# Patient Record
Sex: Female | Born: 2000
Health system: Southern US, Community
[De-identification: ages and names within clinical notes are randomized; demographics above are authoritative.]

## PROBLEM LIST (undated history)

## (undated) DIAGNOSIS — K219 Gastro-esophageal reflux disease without esophagitis: Secondary | ICD-10-CM

---
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
## 2018-04-27 ENCOUNTER — Ambulatory Visit (INDEPENDENT_AMBULATORY_CARE_PROVIDER_SITE_OTHER): Payer: Self-pay | Admitting: Family Medicine

## 2018-04-27 VITALS — BP 118/70 | HR 63 | Temp 98.2°F | Resp 18 | Wt 129.0 lb

## 2018-04-27 DIAGNOSIS — K5903 Drug induced constipation: Secondary | ICD-10-CM

## 2018-04-27 DIAGNOSIS — K529 Noninfective gastroenteritis and colitis, unspecified: Secondary | ICD-10-CM

## 2018-04-27 NOTE — Progress Notes (Signed)
17 y/o who on 8/20 ate sushi with friends and they all became sick afterwards- Beverly Gonzales has since experienced nausea, vomiting, and diarrhea. She was prescribed Bentyl, Prilosec, and Zofran and is now less symptomatic but has constipation and stomach pain. She has frequent urges and gas but is not able to pass any stool. She reports mild abdominal discomfort-   Review of Systems  Constitutional: Negative for chills, fever and malaise/fatigue.  HENT: Negative for congestion, ear discharge, ear pain, sinus pain and sore throat.   Eyes: Negative.   Respiratory: Negative for cough, sputum production and shortness of breath.   Cardiovascular: Negative.  Negative for chest pain.  Gastrointestinal: Positive for abdominal pain and constipation. Negative for nausea and vomiting.  Genitourinary: Negative for dysuria, frequency, hematuria and urgency.  Musculoskeletal: Negative for myalgias.  Skin: Negative.   Neurological: Negative for headaches.  Endo/Heme/Allergies: Negative.   Psychiatric/Behavioral: Negative.     O: Vitals:   04/27/18 1528  BP: 118/70  Pulse: 63  Resp: 18  Temp: 98.2 F (36.8 C)  SpO2: 98%     Physical Exam  Constitutional: She is oriented to person, place, and time. Vital signs are normal. She appears well-developed and well-nourished. She is active.  Non-toxic appearance. She does not have a sickly appearance.  HENT:  Head: Normocephalic.  Right Ear: Hearing, tympanic membrane, external ear and ear canal normal.  Left Ear: Hearing, tympanic membrane, external ear and ear canal normal.  Nose: Nose normal.  Mouth/Throat: Uvula is midline and oropharynx is clear and moist.  Neck: Normal range of motion. Neck supple.  Cardiovascular: Normal rate, regular rhythm, normal heart sounds and normal pulses.  Pulmonary/Chest: Effort normal and breath sounds normal.  Abdominal: Soft. Normal appearance. She exhibits no distension, no pulsatile midline mass and no mass. Bowel  sounds are increased. There is generalized tenderness and tenderness in the right upper quadrant, right lower quadrant, left upper quadrant and left lower quadrant. There is no rigidity, no rebound, no guarding, no CVA tenderness, no tenderness at McBurney's point and negative Murphy's sign.  Abdomen is soft- no guarding mild grimacing with deeper palpation  Musculoskeletal: Normal range of motion.  Lymphadenopathy:       Head (right side): No submental and no submandibular adenopathy present.       Head (left side): No submental and no submandibular adenopathy present.    She has no cervical adenopathy.  Neurological: She is alert and oriented to person, place, and time.  Psychiatric: She has a normal mood and affect.  Vitals reviewed.  A: 1. Gastroenteritis   2. Drug-induced constipation    P: Exam findings, diagnosis etiology and medication use and indications reviewed with patient. Follow- Up and discharge instructions provided. No emergent/urgent issues found on exam.  Patient verbalized understanding of information provided and agrees with plan of care (POC), all questions answered.  1. Gastroenteritis Began 8/20- complications from issues related to condition persists- symptomatic treatment continued Work/school note for the next 48 hours provided- Advised to f/u in clinic or in urgent care if symptoms persist. 2. Drug-induced constipation Abdominal pain persists- but nausea and vomiting resolved- patient take frequent trips to bathroom with urges but denies successful gas or BM since diarrhea resolved

## 2018-04-27 NOTE — Patient Instructions (Addendum)
PLAN Follow up in Urgent care or Emergency room if symptoms worsen or fever develops  Constipation, Adult Constipation is when a person has fewer bowel movements in a week than normal, has difficulty having a bowel movement, or has stools that are dry, hard, or larger than normal. Constipation may be caused by an underlying condition. It may become worse with age if a person takes certain medicines and does not take in enough fluids. Follow these instructions at home: Eating and drinking   Eat foods that have a lot of fiber, such as fresh fruits and vegetables, whole grains, and beans.  Limit foods that are high in fat, low in fiber, or overly processed, such as french fries, hamburgers, cookies, candies, and soda.  Drink enough fluid to keep your urine clear or pale yellow. General instructions  Exercise regularly or as told by your health care provider.  Go to the restroom when you have the urge to go. Do not hold it in.  Take over-the-counter and prescription medicines only as told by your health care provider. These include any fiber supplements.  Practice pelvic floor retraining exercises, such as deep breathing while relaxing the lower abdomen and pelvic floor relaxation during bowel movements.  Watch your condition for any changes.  Keep all follow-up visits as told by your health care provider. This is important. Contact a health care provider if:  You have pain that gets worse.  You have a fever.  You do not have a bowel movement after 4 days.  You vomit.  You are not hungry.  You lose weight.  You are bleeding from the anus.  You have thin, pencil-like stools. Get help right away if:  You have a fever and your symptoms suddenly get worse.  You leak stool or have blood in your stool.  Your abdomen is bloated.  You have severe pain in your abdomen.  You feel dizzy or you faint. This information is not intended to replace advice given to you by your  health care provider. Make sure you discuss any questions you have with your health care provider. Document Released: 05/18/2004 Document Revised: 03/09/2016 Document Reviewed: 02/08/2016 Elsevier Interactive Patient Education  2018 [employer].  Simethicone oral drops, suspension What is this medicine? SIMETHICONE (sye METH i kone) is used to decrease the discomfort caused by gas. This medicine may be used for other purposes; ask your health care provider or pharmacist if you have questions. COMMON BRAND NAME(S): Google, Gas Relief, Gas-X, Genasyme, [employer], Infantaire, Little Remedies for Tummys, Mylicon, PediaCare Infant's Gas Relief What should I tell my health care provider before I take this medicine? They need to know if you have any of these conditions: -an unusual or allergic reaction to simethicone, other medicines, foods, dyes, or preservatives -pregnant or trying to get pregnant -breast-feeding How should I use this medicine? Take this medicine by mouth. Use the special dropper included with the medicine. The dosage can be mixed with one ounce of cool water, infant formula or other suitable liquids to ease administration. Follow the directions on the label or those given to you by your doctor or health care professional. Do not take your medicine more often than directed. Talk to your pediatrician regarding the use of this medicine in children. While this drug may be used in children as young as newborns for selected conditions, precautions do apply. Overdosage: If you think you have taken too much of this medicine contact a poison control  center or emergency room at once. NOTE: This medicine is only for you. Do not share this medicine with others. What if I miss a dose? This does not apply; this medicine is not for regular use. What may interact with this medicine? Interactions are not expected. This list may not describe all possible interactions. Give  your health care provider a list of all the medicines, herbs, non-prescription drugs, or dietary supplements you use. Also tell them if you smoke, drink alcohol, or use illegal drugs. Some items may interact with your medicine. What should I watch for while using this medicine? Tell your doctor or healthcare professional if your symptoms do not start to get better or if they get worse, or if you have severe pain, diarrhea, constipation, or blood in your stool. These could be signs of a more serious condition. What side effects may I notice from receiving this medicine? There are no reported side effects of this medicine. This list may not describe all possible side effects. Call your doctor for medical advice about side effects. You may report side effects to FDA at 1-800-FDA-1088. Where should I keep my medicine? Keep out of the reach of children. Store at room temperature between 15 and 30 degrees C (59 and 86 degrees F). Keep container tightly closed. Throw away any unused medicine after the expiration date. NOTE: This sheet is a summary. It may not cover all possible information. If you have questions about this medicine, talk to your doctor, pharmacist, or health care provider.  2018 Elsevier/Gold Standard (2008-04-21 15:41:57) Docusate capsules What is this medicine? DOCUSATE (doc CUE sayt) is stool softener. It helps prevent constipation and straining or discomfort associated with hard or dry stools. This medicine may be used for other purposes; ask your health care provider or pharmacist if you have questions. COMMON BRAND NAME(S): Colace, Colace Clear, Correctol, D.O.S., DC, Doc-Q-Lace, DocuLace, Docusoft S, DOK, DOK Extra Strength, Dulcolax, Genasoft, Kao-Tin, Kaopectate Liqui-Gels, Phillips Stool Softener, Stool Softener, Stool Softner DC, Sulfolax, Sur-Q-Lax, Surfak, Uni-Ease What should I tell my health care provider before I take this medicine? They need to know if you have any of  these conditions: -nausea or vomiting -severe constipation -stomach pain -sudden change in bowel habit lasting more than 2 weeks -an unusual or allergic reaction to docusate, other medicines, foods, dyes, or preservatives -pregnant or trying to get pregnant -breast-feeding How should I use this medicine? Take this medicine by mouth with a glass of water. Follow the directions on the label. Take your doses at regular intervals. Do not take your medicine more often than directed. Talk to your pediatrician regarding the use of this medicine in children. While this medicine may be prescribed for children as young as 2 years for selected conditions, precautions do apply. Overdosage: If you think you have taken too much of this medicine contact a poison control center or emergency room at once. NOTE: This medicine is only for you. Do not share this medicine with others. What if I miss a dose? If you miss a dose, take it as soon as you can. If it is almost time for your next dose, take only that dose. Do not take double or extra doses. What may interact with this medicine? -mineral oil This list may not describe all possible interactions. Give your health care provider a list of all the medicines, herbs, non-prescription drugs, or dietary supplements you use. Also tell them if you smoke, drink alcohol, or use illegal drugs. Some items  may interact with your medicine. What should I watch for while using this medicine? Do not use for more than one week without advice from your doctor or health care professional. If your constipation returns, check with your doctor or health care professional. Drink plenty of water while taking this medicine. Drinking water helps decrease constipation. Stop using this medicine and contact your doctor or health care professional if you experience any rectal bleeding or do not have a bowel movement after use. These could be signs of a more serious condition. What side  effects may I notice from receiving this medicine? Side effects that you should report to your doctor or health care professional as soon as possible: -allergic reactions like skin rash, itching or hives, swelling of the face, lips, or tongue Side effects that usually do not require medical attention (report to your doctor or health care professional if they continue or are bothersome): -diarrhea -stomach cramps -throat irritation This list may not describe all possible side effects. Call your doctor for medical advice about side effects. You may report side effects to FDA at 1-800-FDA-1088. Where should I keep my medicine? Keep out of the reach of children. Store at room temperature between 15 and 30 degrees C (59 and 86 degrees F). Throw away any unused medicine after the expiration date. NOTE: This sheet is a summary. It may not cover all possible information. If you have questions about this medicine, talk to your doctor, pharmacist, or health care provider.  2018 Elsevier/Gold Standard (2007-12-11 15:56:49) Abdominal Pain, Adult Many things can cause belly (abdominal) pain. Most times, belly pain is not dangerous. Many cases of belly pain can be watched and treated at home. Sometimes belly pain is serious, though. Your doctor will try to find the cause of your belly pain. Follow these instructions at home:  Take over-the-counter and prescription medicines only as told by your doctor. Do not take medicines that help you poop (laxatives) unless told to by your doctor.  Drink enough fluid to keep your pee (urine) clear or pale yellow.  Watch your belly pain for any changes.  Keep all follow-up visits as told by your doctor. This is important. Contact a doctor if:  Your belly pain changes or gets worse.  You are not hungry, or you lose weight without trying.  You are having trouble pooping (constipated) or have watery poop (diarrhea) for more than 2-3 days.  You have pain when you  pee or poop.  Your belly pain wakes you up at night.  Your pain gets worse with meals, after eating, or with certain foods.  You are throwing up and cannot keep anything down.  You have a fever. Get help right away if:  Your pain does not go away as soon as your doctor says it should.  You cannot stop throwing up.  Your pain is only in areas of your belly, such as the right side or the left lower part of the belly.  You have bloody or black poop, or poop that looks like tar.  You have very bad pain, cramping, or bloating in your belly.  You have signs of not having enough fluid or water in your body (dehydration), such as: ? Dark pee, very little pee, or no pee. ? Cracked lips. ? Dry mouth. ? Sunken eyes. ? Sleepiness. ? Weakness. This information is not intended to replace advice given to you by your health care provider. Make sure you discuss any questions you have with  your health care provider. Document Released: 02/06/2008 Document Revised: 03/09/2016 Document Reviewed: 02/01/2016 Elsevier Interactive Patient Education  2018 [employer]

## 2018-04-29 ENCOUNTER — Encounter: Payer: Self-pay | Admitting: Family Medicine

## 2018-05-02 ENCOUNTER — Emergency Department (HOSPITAL_BASED_OUTPATIENT_CLINIC_OR_DEPARTMENT_OTHER)
Admission: EM | Admit: 2018-05-02 | Discharge: 2018-05-02 | Disposition: A | Discharge status: Home / Self Care | Payer: No Typology Code available for payment source | Attending: Emergency Medicine | Admitting: Emergency Medicine

## 2018-05-02 ENCOUNTER — Emergency Department (HOSPITAL_BASED_OUTPATIENT_CLINIC_OR_DEPARTMENT_OTHER): Payer: No Typology Code available for payment source

## 2018-05-02 ENCOUNTER — Encounter (HOSPITAL_COMMUNITY): Payer: Self-pay | Admitting: Emergency Medicine

## 2018-05-02 ENCOUNTER — Ambulatory Visit (HOSPITAL_COMMUNITY)
Admission: EM | Admit: 2018-05-02 | Discharge: 2018-05-02 | Disposition: A | Discharge status: Home / Self Care | Payer: No Typology Code available for payment source | Source: Home / Self Care | Attending: Internal Medicine | Admitting: Internal Medicine

## 2018-05-02 ENCOUNTER — Other Ambulatory Visit: Payer: Self-pay

## 2018-05-02 DIAGNOSIS — K5900 Constipation, unspecified: Secondary | ICD-10-CM | POA: Diagnosis not present

## 2018-05-02 DIAGNOSIS — Z79899 Other long term (current) drug therapy: Secondary | ICD-10-CM | POA: Insufficient documentation

## 2018-05-02 DIAGNOSIS — R112 Nausea with vomiting, unspecified: Secondary | ICD-10-CM | POA: Diagnosis not present

## 2018-05-02 DIAGNOSIS — Z3202 Encounter for pregnancy test, result negative: Secondary | ICD-10-CM | POA: Insufficient documentation

## 2018-05-02 DIAGNOSIS — R1031 Right lower quadrant pain: Secondary | ICD-10-CM | POA: Insufficient documentation

## 2018-05-02 DIAGNOSIS — R1084 Generalized abdominal pain: Secondary | ICD-10-CM

## 2018-05-02 LAB — CBC
HCT: 36.6 % (ref 36.0–46.0)
Hemoglobin: 12.5 g/dL (ref 12.0–15.0)
MCH: 29.1 pg (ref 26.0–34.0)
MCHC: 34.2 g/dL (ref 30.0–36.0)
MCV: 85.3 fL (ref 78.0–100.0)
Platelets: 167 10*3/uL (ref 150–400)
RBC: 4.29 MIL/uL (ref 3.87–5.11)
RDW: 12.8 % (ref 11.5–15.5)
WBC: 7 10*3/uL (ref 4.0–10.5)

## 2018-05-02 LAB — POCT URINALYSIS DIP (DEVICE)
Glucose, UA: NEGATIVE mg/dL
Hgb urine dipstick: NEGATIVE
KETONES UR: NEGATIVE mg/dL
Leukocytes, UA: NEGATIVE
Nitrite: NEGATIVE
PH: 6 (ref 5.0–8.0)
PROTEIN: 30 mg/dL — AB
Specific Gravity, Urine: >=1.03 (ref 1.005–1.030)
Urobilinogen, UA: 0.2 mg/dL (ref 0.0–1.0)

## 2018-05-02 LAB — COMPREHENSIVE METABOLIC PANEL
ALT: 10 U/L (ref 0–44)
AST: 15 U/L (ref 15–41)
Albumin: 4.3 g/dL (ref 3.5–5.0)
Alkaline Phosphatase: 44 U/L (ref 38–126)
Anion gap: 9 (ref 5–15)
BILIRUBIN TOTAL: 1 mg/dL (ref 0.3–1.2)
BUN: 14 mg/dL (ref 6–20)
CO2: 24 mmol/L (ref 22–32)
Calcium: 9.3 mg/dL (ref 8.9–10.3)
Chloride: 103 mmol/L (ref 98–111)
Creatinine, Ser: 0.58 mg/dL (ref 0.44–1.00)
GFR calc Af Amer: >60 mL/min (ref >60)
Glucose, Bld: 122 mg/dL — ABNORMAL HIGH (ref 70–99)
Potassium: 3.6 mmol/L (ref 3.5–5.1)
Sodium: 136 mmol/L (ref 135–145)
Total Protein: 6.8 g/dL (ref 6.5–8.1)

## 2018-05-02 LAB — POCT PREGNANCY, URINE: Preg Test, Ur: NEGATIVE

## 2018-05-02 LAB — LIPASE, BLOOD: Lipase: 32 U/L (ref 11–51)

## 2018-05-02 MED ORDER — IOPAMIDOL (ISOVUE-300) INJECTION 61%
30.0000 mL | Freq: Once | INTRAVENOUS | Status: AC | PRN
  Administered 2018-05-02: 15 mL via ORAL

## 2018-05-02 MED ORDER — SODIUM CHLORIDE 0.9 % IV BOLUS
1000.0000 mL | Freq: Once | INTRAVENOUS | Status: AC
  Administered 2018-05-02: 1000 mL via INTRAVENOUS

## 2018-05-02 MED ORDER — IOPAMIDOL (ISOVUE-300) INJECTION 61%
100.0000 mL | Freq: Once | INTRAVENOUS | Status: AC | PRN
  Administered 2018-05-02: 100 mL via INTRAVENOUS

## 2018-05-02 NOTE — ED Notes (Signed)
Patient transported to CT 

## 2018-05-02 NOTE — ED Notes (Signed)
Father in hallway asking when pt is going for CT, informed that they have someone on the table and that they will be over to get his daughter when they are finished

## 2018-05-02 NOTE — Discharge Instructions (Addendum)
Urine did not show signs of infection Urine pregnancy was negative Recommending further evaluation and management in the ED Patient and mother aware.

## 2018-05-02 NOTE — Discharge Instructions (Addendum)
You were evaluated in the emergency department for 10 days of diffuse crampy abdominal pain.  You had blood work that was unremarkable.  Your CAT scan did not show an obvious cause of your symptoms other than moderate amount of constipation which could potentially be causing some of your symptoms.  We do recommend that you increase fiber and continue the stool softener and follow-up with your primary care doctor.  They may need to refer you onto a gastroenterologist.

## 2018-05-02 NOTE — ED Triage Notes (Signed)
Pt reports 1 episode of vomiting today after eating. However, pt ate on the way to ED.

## 2018-05-02 NOTE — ED Provider Notes (Addendum)
[facility]   [id number] 05/02/18 Arrival Time: 1834  CC: ABDOMINAL DISCOMFORT  SUBJECTIVE: Mother present.  Beverly Gonzales is a 17 y.o. female who presents with complaint of abdominal discomfort that began abruptly 10 days ago.  Symptoms began after eating sushi.  Discomfort is diffuse about the abdomen.  Describes as stable constant and "pressure."  Pain is 5-6/10.  Has tried bentyl, zyrtec, and prilosec, then treated with stool softeners and gas-x without relief.  Worse in sitting position.  Denies similar symptoms in the past.  Last BM last night with soft stool.    Denies fever, chills, appetite changes, weight changes, nausea, vomiting, chest pain, SOB, diarrhea, constipation, hematochezia, melena, dysuria, difficulty urinating, increased frequency or urgency, flank pain, loss of bowel or bladder function.  No LMP recorded.  ROS: As per HPI.  Past Medical History:  Diagnosis Date  . GERD (gastroesophageal reflux disease)    History reviewed. No pertinent surgical history. No Known Allergies No current facility-administered medications on file prior to encounter.    Current Outpatient Medications on File Prior to Encounter  Medication Sig Dispense Refill  . omeprazole (PRILOSEC) 10 MG capsule Take 10 mg by mouth daily.    . ondansetron (ZOFRAN) 4 MG tablet Take 4 mg by mouth every 8 (eight) hours as needed for nausea or vomiting.     Social History   Socioeconomic History  . Marital status: Single    Spouse name: Not on file  . Number of children: Not on file  . Years of education: Not on file  . Highest education level: Not on file  Occupational History  . Not on file  Social Needs  . Financial resource strain: Not on file  . Food insecurity:    Worry: Not on file    Inability: Not on file  . Transportation needs:    Medical: Not on file    Non-medical: Not on file  Tobacco Use  . Smoking status: Never Smoker  Substance and Sexual Activity  .  Alcohol use: No  . Drug use: Not on file  . Sexual activity: Not on file  Lifestyle  . Physical activity:    Days per week: Not on file    Minutes per session: Not on file  . Stress: Not on file  Relationships  . Social connections:    Talks on phone: Not on file    Gets together: Not on file    Attends religious service: Not on file    Active member of club or organization: Not on file    Attends meetings of clubs or organizations: Not on file    Relationship status: Not on file  . Intimate partner violence:    Fear of current or ex partner: Not on file    Emotionally abused: Not on file    Physically abused: Not on file    Forced sexual activity: Not on file  Other Topics Concern  . Not on file  Social History Narrative  . Not on file   No family history on file.   OBJECTIVE:  Vitals:   05/02/18 1901  BP: 101/67  Pulse: 87  Resp: 18  Temp: 98.1 F (36.7 C)  SpO2: 95%    General appearance: AOx3 in no acute distress; nontoxic appearance HEENT: NCAT. PERRL. Oropharynx clear.  Lungs: clear to auscultation bilaterally without adventitious breath sounds Heart: regular rate and rhythm.  Radial pulses 2+ symmetrical bilaterally Abdomen: soft, non-distended; normal active bowel sounds; diffuse abdominal  tenderness; mild guarding Back: no CVA tenderness Extremities: no edema; symmetrical with no gross deformities Skin: warm and dry Neurologic: normal gait Psychological: alert and cooperative; normal mood and affect  Labs: Results for orders placed or performed during the hospital encounter of 05/02/18 (from the past 24 hour(s))  POCT urinalysis dip (device)     Status: Abnormal   Collection Time: 05/02/18  7:10 PM  Result Value Ref Range   Glucose, UA NEGATIVE NEGATIVE mg/dL   Bilirubin Urine SMALL (A) NEGATIVE   Ketones, ur NEGATIVE NEGATIVE mg/dL   Specific Gravity, Urine >=1.030 1.005 - 1.030   Hgb urine dipstick NEGATIVE NEGATIVE   pH 6.0 5.0 - 8.0    Protein, ur 30 (A) NEGATIVE mg/dL   Urobilinogen, UA 0.2 0.0 - 1.0 mg/dL   Nitrite NEGATIVE NEGATIVE   Leukocytes, UA NEGATIVE NEGATIVE  Pregnancy, urine POC     Status: None   Collection Time: 05/02/18  7:13 PM  Result Value Ref Range   Preg Test, Ur NEGATIVE NEGATIVE    ASSESSMENT & PLAN:  1. Generalized abdominal pain     No orders of the defined types were placed in this encounter.  Urine did not show signs of infection Urine pregnancy was negative Recommending further evaluation and management in the ED Patient and mother aware.    More than 30 minutes spent discussing options regarding patient's care with mother and patient.  Gave option of outpatient treatment or further evaluation and management in the ED.  Patient attending college in [place], and [place] located in [place].  Patient's mother is very concerned about her daughters abdominal pain.    Reviewed expectations re: course of current medical issues. Questions answered. Outlined signs and symptoms indicating need for more acute intervention. Patient verbalized understanding. After Visit Summary given.      Rennis HardingWurst, Raniyah Curenton, PA-C 05/02/18 2052

## 2018-05-02 NOTE — ED Triage Notes (Signed)
Pt c/o stomach pain x10 days, pt was seen at [facility] on 8/25 and given bentyl, prilosec, and zofran. Pt still feels stomach pressure. Pt has off and on diarrhea and constipation.

## 2018-05-02 NOTE — ED Notes (Signed)
Jenna from radiology dropped off oral contrast for the patient and told her she would be back in about 20-25 minutes to get her for her scan.

## 2018-05-02 NOTE — ED Provider Notes (Signed)
[facility] EMERGENCY DEPARTMENT Provider Note   CSN: [id number] Arrival date & time: 05/02/18  2056     History   Chief Complaint Chief Complaint  Patient presents with  . Abdominal Pain    HPI Beverly Gonzales is a 17 y.o. female.  She is complaining of almost 2 weeks of on and off abdominal pain with associated nausea and vomiting.  Its generalized abdominal pain seems migratory at some level and pressure.  She initially had some loose stool and vomiting and they thought it was related to see she she ate.  She was treated at her school clinic with Bentyl and I believe ranitidine.  The pain continued and she stopped the medicine and then they told her she may be constipated and told her to take a laxative.  She ended up with diarrhea and now she feels she is more constipated again.  She is been back and forth from school and unable to attend class secondary to the pain.  She is brought in by her mother and father tonight after going to urgent care earlier today and they felt she should be seen for further evaluation.  She is also been on a PPI for this.  She does not normally have GI symptoms and denies being under any increased stress.  No fevers no chills no cough no shortness of breath.  She denies any vaginal bleeding or discharge and denies being sexually active.  She had a clean urine and a negative pregnancy test today.  The history is provided by the patient and a parent.  Abdominal Pain   This is a new problem. The current episode started more than 1 week ago. The problem occurs constantly. The problem has not changed since onset.The pain is associated with eating. The pain is located in the generalized abdominal region. The quality of the pain is aching and pressure-like. The pain is moderate. Associated symptoms include diarrhea, nausea, vomiting and constipation. Pertinent negatives include fever, dysuria and frequency. The symptoms are aggravated by eating and certain  positions. The symptoms are relieved by certain positions.    Past Medical History:  Diagnosis Date  . GERD (gastroesophageal reflux disease)     Patient Active Problem List   Diagnosis Date Noted  . Chest wall pain 06/23/2013  . 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   OB History   None      Home Medications    Prior to Admission medications   Medication Sig Start Date End Date Taking? Authorizing Provider  omeprazole (PRILOSEC) 10 MG capsule Take 10 mg by mouth daily.    [provider]  ondansetron (ZOFRAN) 4 MG tablet Take 4 mg by mouth every 8 (eight) hours as needed for nausea or vomiting.    [provider]    Family History No family history on file.  Social History Social History   Tobacco Use  . Smoking status: Never Smoker  Substance Use Topics  . Alcohol use: No  . Drug use: Not on file     Allergies   Patient has no known allergies.   Review of Systems Review of Systems  Constitutional: Negative for fever.  HENT: Negative for sore throat.   Eyes: Negative for visual disturbance.  Respiratory: Negative for shortness of breath.   Cardiovascular: Negative for chest pain.  Gastrointestinal: Positive for abdominal pain, constipation, diarrhea, nausea and vomiting.  Genitourinary: Negative for dysuria and frequency.  Musculoskeletal: Negative  for neck pain.  Skin: Negative for rash.  Neurological: Negative for numbness.     Physical Exam Updated Vital Signs BP 115/63 (BP Location: Left Arm)   Pulse 71   Temp 98.4 F (36.9 C) (Oral)   Resp 16   Ht 5\' 7"  (1.702 m)   Wt 58.5 kg   SpO2 100%   BMI 20.20 kg/m   Physical Exam  Constitutional: She appears well-developed and well-nourished. No distress.  HENT:  Head: Normocephalic and atraumatic.  Mouth/Throat: Oropharynx is clear and moist.  Eyes: Conjunctivae are normal.  Neck: Neck supple.  Cardiovascular: Normal rate, regular rhythm  and normal heart sounds.  No murmur heard. Pulmonary/Chest: Effort normal and breath sounds normal. No respiratory distress.  Abdominal: Soft. Normal appearance. There is generalized tenderness. There is no rigidity, no rebound and no guarding.  Musculoskeletal: Normal range of motion. She exhibits no edema, tenderness or deformity.  Neurological: She is alert.  Skin: Skin is warm and dry. Capillary refill takes less than 2 seconds.  Psychiatric: She has a normal mood and affect.  Nursing note and vitals reviewed.    ED Treatments / Results  Labs (all labs ordered are listed, but only abnormal results are displayed) Labs Reviewed  COMPREHENSIVE METABOLIC PANEL - Abnormal; Notable for the following components:      Result Value   Glucose, Bld 122 (*)    All other components within normal limits  LIPASE, BLOOD  CBC    EKG None  Radiology Ct Abdomen Pelvis W Contrast  Result Date: 05/02/2018 CLINICAL DATA:  Right lower quadrant pain with nausea, vomiting and some constipation for 10 days. EXAM: CT ABDOMEN AND PELVIS WITH CONTRAST TECHNIQUE: Multidetector CT imaging of the abdomen and pelvis was performed using the standard protocol following bolus administration of intravenous contrast. CONTRAST:  15mL ISOVUE-300 IOPAMIDOL (ISOVUE-300) INJECTION 61%, 100mL ISOVUE-300 IOPAMIDOL (ISOVUE-300) INJECTION 61% COMPARISON:  None. FINDINGS: Lower chest: Normal size heart. Clear lung bases. No pericardial effusion. Hepatobiliary: Mild periportal edema which can be seen in hypervolemia, hepatic congestion and hepatitis among some possibilities though not exclusive. Given distended appearance of the IVC, suspect hypervolemia. No space-occupying mass of the liver. Fatty infiltration along the falciform ligament is seen. Gallbladder is contracted and free of stones. Pancreas: Normal Spleen: Normal Adrenals/Urinary Tract: Normal bilateral adrenal glands and kidneys. Mild fullness of the renal pelves  without hydroureteronephrosis. The urinary bladder is unremarkable. No bladder calculus or focal mural thickening. Stomach/Bowel: Normal appearing appendix is visualized. The stomach is somewhat distended with ingested food and contrast. The duodenal sweep and ligament of Treitz is well small bowel are unremarkable. Increased fecal retention is seen within the colon. Vascular/Lymphatic: No significant vascular findings are present. No enlarged abdominal or pelvic lymph nodes. Reproductive: Uterus is retroverted and retroflexed. No adnexal mass. Physiologic sized follicles are identified with dominant follicle noted on the left measuring up to 2 cm. Other: No free air nor free fluid. Musculoskeletal: No acute or significant osseous findings. IMPRESSION: 1. No findings for the patient's right lower quadrant pain apart from increased fecal retention within the colon. The appendix is normal in appearance. No obstructive uropathy is seen. 2. Mild periportal edema is nonspecific but can be seen in hypervolemic state, hepatitis or hepatic congestion among some possibilities. Suspect hypervolemia given distended IVC. Electronically Signed   By: Tollie Ethavid  Kwon M.D.   On: 05/02/2018 23:09    Procedures Procedures (including critical care time)  Medications Ordered in ED Medications  sodium chloride  0.9 % bolus 1,000 mL (has no administration in time range)     Initial Impression / Assessment and Plan / ED Course  I have reviewed the triage vital signs and the nursing notes.  Pertinent labs & imaging results that were available during my care of the patient were reviewed by me and considered in my medical decision making (see chart for details).  Clinical Course as of May 04 951  Fri May 02, 2018  2318 Patient's lab work is unremarkable here.  She also had a negative pregnancy and a urinalysis earlier today that I reviewed.  Her CT is most significant for some increased fecal retention in her colon.  I  reviewed this with the patient and her mom and recommended that she increase fiber and continue stool softener.  I also recommended that she follow-up with her primary care doctor and consider getting referred on to a GI doctor.   [MB]    Clinical Course User Index [MB] Terrilee Files, MD     Final Clinical Impressions(s) / ED Diagnoses   Final diagnoses:  Generalized abdominal pain  Constipation, unspecified constipation type    ED Discharge Orders    None       Terrilee Files, MD 05/03/18 [phone]

## 2018-05-02 NOTE — ED Triage Notes (Signed)
Pt c/o abd >10 days. Pt has been seen several times for same. [facility] Pt reports pain across upper abd.

## 2018-05-02 NOTE — ED Triage Notes (Signed)
[facility] and were both [facility].

## 2018-05-26 ENCOUNTER — Telehealth: Payer: Self-pay | Admitting: Internal Medicine

## 2018-05-26 ENCOUNTER — Ambulatory Visit (INDEPENDENT_AMBULATORY_CARE_PROVIDER_SITE_OTHER): Payer: No Typology Code available for payment source | Admitting: Internal Medicine

## 2018-05-26 ENCOUNTER — Encounter: Payer: Self-pay | Admitting: Internal Medicine

## 2018-05-26 ENCOUNTER — Other Ambulatory Visit (INDEPENDENT_AMBULATORY_CARE_PROVIDER_SITE_OTHER): Payer: No Typology Code available for payment source

## 2018-05-26 VITALS — BP 102/72 | HR 60 | Temp 98.3°F | Resp 16 | Ht 67.3 in | Wt 127.8 lb

## 2018-05-26 DIAGNOSIS — Z0000 Encounter for general adult medical examination without abnormal findings: Secondary | ICD-10-CM

## 2018-05-26 DIAGNOSIS — R739 Hyperglycemia, unspecified: Secondary | ICD-10-CM

## 2018-05-26 DIAGNOSIS — R10814 Left lower quadrant abdominal tenderness: Secondary | ICD-10-CM | POA: Diagnosis not present

## 2018-05-26 DIAGNOSIS — A084 Viral intestinal infection, unspecified: Secondary | ICD-10-CM | POA: Insufficient documentation

## 2018-05-26 LAB — CBC WITH DIFFERENTIAL/PLATELET
Basophils Absolute: 0 10*3/uL (ref 0.0–0.1)
Basophils Relative: 0.8 % (ref 0.0–3.0)
EOS PCT: 1.9 % (ref 0.0–5.0)
Eosinophils Absolute: 0.1 10*3/uL (ref 0.0–0.7)
HCT: 38.6 % (ref 36.0–49.0)
Hemoglobin: 13.4 g/dL (ref 12.0–16.0)
LYMPHS ABS: 1.8 10*3/uL (ref 0.7–4.0)
Lymphocytes Relative: 33 % (ref 24.0–48.0)
MCHC: 34.7 g/dL (ref 31.0–37.0)
MCV: 83.2 fl (ref 78.0–98.0)
MONO ABS: 0.4 10*3/uL (ref 0.1–1.0)
Monocytes Relative: 7.8 % (ref 3.0–12.0)
Neutro Abs: 3.1 10*3/uL (ref 1.4–7.7)
Neutrophils Relative %: 56.5 % (ref 43.0–71.0)
PLATELETS: 183 10*3/uL (ref 150.0–575.0)
RBC: 4.64 Mil/uL (ref 3.80–5.70)
RDW: 13.3 % (ref 11.4–15.5)
WBC: 5.6 10*3/uL (ref 4.5–13.5)

## 2018-05-26 LAB — URINALYSIS, ROUTINE W REFLEX MICROSCOPIC
BILIRUBIN URINE: NEGATIVE
HGB URINE DIPSTICK: NEGATIVE
Ketones, ur: NEGATIVE
LEUKOCYTES UA: NEGATIVE
NITRITE: NEGATIVE
Specific Gravity, Urine: >=1.03 — AB (ref 1.000–1.030)
TOTAL PROTEIN, URINE-UPE24: NEGATIVE
URINE GLUCOSE: NEGATIVE
UROBILINOGEN UA: 0.2 (ref 0.0–1.0)
pH: 6 (ref 5.0–8.0)

## 2018-05-26 LAB — COMPREHENSIVE METABOLIC PANEL
ALT: 11 U/L (ref 0–35)
AST: 10 U/L (ref 0–37)
Albumin: 4.7 g/dL (ref 3.5–5.2)
Alkaline Phosphatase: 44 U/L — ABNORMAL LOW (ref 47–119)
BUN: 15 mg/dL (ref 6–23)
CHLORIDE: 104 meq/L (ref 96–112)
CO2: 29 mEq/L (ref 19–32)
Calcium: 10.3 mg/dL (ref 8.4–10.5)
Creatinine, Ser: 0.77 mg/dL (ref 0.40–1.20)
GFR: 101.95 mL/min (ref >60.00)
GLUCOSE: 92 mg/dL (ref 70–99)
POTASSIUM: 3.8 meq/L (ref 3.5–5.1)
SODIUM: 140 meq/L (ref 135–145)
Total Bilirubin: 0.9 mg/dL (ref 0.2–1.2)
Total Protein: 7.4 g/dL (ref 6.0–8.3)

## 2018-05-26 LAB — HEMOGLOBIN A1C: Hgb A1c MFr Bld: 4.7 % (ref 4.6–6.5)

## 2018-05-26 LAB — LIPID PANEL
Cholesterol: 150 mg/dL (ref 0–200)
HDL: 57.2 mg/dL (ref >39.00)
LDL CALC: 73 mg/dL (ref 0–99)
NONHDL: 92.31
Total CHOL/HDL Ratio: 3
Triglycerides: 99 mg/dL (ref 0.0–149.0)
VLDL: 19.8 mg/dL (ref 0.0–40.0)

## 2018-05-26 LAB — LIPASE: Lipase: 21 U/L (ref 11.0–59.0)

## 2018-05-26 LAB — HCG, QUANTITATIVE, PREGNANCY: Quantitative HCG: 0.16 m[IU]/mL

## 2018-05-26 LAB — SEDIMENTATION RATE: Sed Rate: 7 mm/hr (ref 0–20)

## 2018-05-26 MED ORDER — RESTORA PO CAPS: 1.0000 | ORAL_CAPSULE | Freq: Every day | ORAL | 1 refills | Status: DC

## 2018-05-26 NOTE — Telephone Encounter (Signed)
Patient called requesting lab results and would like a call back

## 2018-05-26 NOTE — Patient Instructions (Signed)

## 2018-05-26 NOTE — Telephone Encounter (Signed)
Charted in result notes. 

## 2018-05-26 NOTE — Progress Notes (Signed)
Subjective:  Patient ID: Beverly Gonzales, female    DOB: 01/12/2001  Age: 17 y.o. MRN: [id number]  CC: Abdominal Pain and Annual Exam  NEW TO ME  HPI Beverly MarryOlga Pettie presents for a CPX.  She was seen in the ED about a month ago with the acute onset of diarrhea after eating sushi.  She continues to have what she describes as loose bowel movements about 5 times a day but she denies diarrhea or bloody stools.  She continues to have a few episodes of aching discomfort in her left lower quadrant and suprapubic region.  She has a normal appetite and denies nausea, vomiting, fever, chills, dysuria, or hematuria.  She is a virgin.  Outpatient Medications Prior to Visit  Medication Sig Dispense Refill  . omeprazole (PRILOSEC) 10 MG capsule Take 10 mg by mouth daily.    . ondansetron (ZOFRAN) 4 MG tablet Take 4 mg by mouth every 8 (eight) hours as needed for nausea or vomiting.     No facility-administered medications prior to visit.     ROS Review of Systems  Constitutional: Negative for appetite change, chills, diaphoresis, fatigue, fever and unexpected weight change.  HENT: Negative.   Eyes: Negative for visual disturbance.  Respiratory: Negative for cough, chest tightness, shortness of breath and wheezing.   Cardiovascular: Negative for chest pain, palpitations and leg swelling.  Gastrointestinal: Positive for abdominal pain. Negative for abdominal distention, blood in stool, constipation, diarrhea, nausea and vomiting.  Endocrine: Negative.   Genitourinary: Negative.  Negative for difficulty urinating, dysuria, flank pain, hematuria, menstrual problem, pelvic pain, vaginal bleeding, vaginal discharge and vaginal pain.  Musculoskeletal: Negative.  Negative for arthralgias.  Skin: Negative for color change, pallor and rash.  Allergic/Immunologic: Negative.   Neurological: Negative.  Negative for dizziness, weakness and light-headedness.  Hematological: Negative for adenopathy. Does not  bruise/bleed easily.  Psychiatric/Behavioral: Negative.     Objective:  BP 102/72 (BP Location: Left Arm, Patient Position: Sitting, Cuff Size: Normal)   Pulse 60   Temp 98.3 F (36.8 C) (Oral)   Resp 16   Ht 5' 7.3" (1.709 m)   Wt 127 lb 12 oz (57.9 kg)   LMP 05/19/2018   SpO2 99%   BMI 19.83 kg/m   BP Readings from Last 3 Encounters:  05/26/18 102/72  05/02/18 100/75  05/02/18 101/67    Wt Readings from Last 3 Encounters:  05/26/18 127 lb 12 oz (57.9 kg) (50 %, Z= 0.00)*  05/02/18 128 lb 15.5 oz (58.5 kg) (52 %, Z= 0.06)*  04/27/18 129 lb (58.5 kg) (53 %, Z= 0.06)*   * Growth percentiles are based on CDC (Girls, 2-20 Years) data.    Physical Exam  Constitutional: She is oriented to person, place, and time. No distress.  HENT:  Mouth/Throat: No oropharyngeal exudate.  Eyes: No scleral icterus.  Neck: Normal range of motion. Neck supple. No JVD present. No thyromegaly present.  Cardiovascular: Normal rate, regular rhythm and normal heart sounds. Exam reveals no gallop.  No murmur heard. Pulmonary/Chest: Effort normal and breath sounds normal. No respiratory distress. She has no wheezes. She has no rales.  Abdominal: Soft. Normal appearance and bowel sounds are normal. She exhibits no distension and no mass. There is no hepatosplenomegaly. There is tenderness in the left lower quadrant. There is no rigidity, no guarding, no CVA tenderness, no tenderness at McBurney's point and negative Murphy's sign. No hernia.  Musculoskeletal: Normal range of motion. She exhibits no edema, tenderness or deformity.  Lymphadenopathy:    She has no cervical adenopathy.  Neurological: She is alert and oriented to person, place, and time.  Skin: Skin is warm and dry. No pallor.  Psychiatric: She has a normal mood and affect. Her behavior is normal. Judgment and thought content normal.  Vitals reviewed.   Lab Results  Component Value Date   WBC 5.6 05/26/2018   HGB 13.4 05/26/2018    HCT 38.6 05/26/2018   PLT 183.0 05/26/2018   GLUCOSE 92 05/26/2018   CHOL 150 05/26/2018   TRIG 99.0 05/26/2018   HDL 57.20 05/26/2018   LDLCALC 73 05/26/2018   ALT 11 05/26/2018   AST 10 05/26/2018   NA 140 05/26/2018   K 3.8 05/26/2018   CL 104 05/26/2018   CREATININE 0.77 05/26/2018   BUN 15 05/26/2018   CO2 29 05/26/2018   HGBA1C 4.7 05/26/2018    Ct Abdomen Pelvis W Contrast  Result Date: 05/02/2018 CLINICAL DATA:  Right lower quadrant pain with nausea, vomiting and some constipation for 10 days. EXAM: CT ABDOMEN AND PELVIS WITH CONTRAST TECHNIQUE: Multidetector CT imaging of the abdomen and pelvis was performed using the standard protocol following bolus administration of intravenous contrast. CONTRAST:  15mL ISOVUE-300 IOPAMIDOL (ISOVUE-300) INJECTION 61%, ISOVUE-300 IOPAMIDOL (ISOVUE-300) INJECTION 61% COMPARISON:  None. FINDINGS: Lower chest: Normal size heart. Clear lung bases. No pericardial effusion. Hepatobiliary: Mild periportal edema which can be seen in hypervolemia, hepatic congestion and hepatitis among some possibilities though not exclusive. Given distended appearance of the IVC, suspect hypervolemia. No space-occupying mass of the liver. Fatty infiltration along the falciform ligament is seen. Gallbladder is contracted and free of stones. Pancreas: Normal Spleen: Normal Adrenals/Urinary Tract: Normal bilateral adrenal glands and kidneys. Mild fullness of the renal pelves without hydroureteronephrosis. The urinary bladder is unremarkable. No bladder calculus or focal mural thickening. Stomach/Bowel: Normal appearing appendix is visualized. The stomach is somewhat distended with ingested food and contrast. The duodenal sweep and ligament of Treitz is well small bowel are unremarkable. Increased fecal retention is seen within the colon. Vascular/Lymphatic: No significant vascular findings are present. No enlarged abdominal or pelvic lymph nodes. Reproductive: Uterus is  retroverted and retroflexed. No adnexal mass. Physiologic sized follicles are identified with dominant follicle noted on the left measuring up to 2 cm. Other: No free air nor free fluid. Musculoskeletal: No acute or significant osseous findings. IMPRESSION: 1. No findings for the patient's right lower quadrant pain apart from increased fecal retention within the colon. The appendix is normal in appearance. No obstructive uropathy is seen. 2. Mild periportal edema is nonspecific but can be seen in hypervolemic state, hepatitis or hepatic congestion among some possibilities. Suspect hypervolemia given distended IVC. Electronically Signed   By: Tollie Eth M.D.   On: 05/02/2018 23:09    Assessment & Plan:   Jury was seen today for abdominal pain and annual exam.  Diagnoses and all orders for this visit:  Left lower quadrant abdominal tenderness without rebound tenderness- She has mild symptoms, her abdominal exam is not consistent with any acute process and her lab work is all normal.  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  I have asked her to try a probiotic for this. -     CBC with Differential/Platelet; Future -     Lipase; Future -     Urinalysis, Routine w reflex microscopic; Future -     hCG, quantitative, pregnancy; Future -     Sedimentation rate; Future  Routine  general medical examination at a health care facility- Exam completed, labs reviewed, she refused a flu vaccine, Pap smear is not indicated, patient education material was given. -     HIV Antibody (routine testing w rflx); Future -     Lipid panel; Future  Hyperglycemia- Her blood sugar is normal now. -     Hemoglobin A1c; Future -     Comprehensive metabolic panel; Future  Gastroenteritis and colitis, viral -     Probiotic Product (RESTORA) CAPS; Take 1 capsule by mouth daily.   I have discontinued Marian Sorrow Ogando's ondansetron and omeprazole. I am also having her start on RESTORA.  Meds ordered this  encounter  Medications  . Probiotic Product (RESTORA) CAPS    Sig: Take 1 capsule by mouth daily.    Dispense:  30 capsule    Refill:  1     Follow-up: Return if symptoms worsen or fail to improve.  Sanda Linger, MD

## 2018-05-26 NOTE — Telephone Encounter (Signed)
Copied from CRM [id number]. Topic: Quick Communication - Lab Results >> May 26, 2018  3:46 PM Cairrikier Kendrick Ranchavidson, [place], [place] wrote: May give patient results.

## 2018-05-27 LAB — HIV ANTIBODY (ROUTINE TESTING W REFLEX): HIV: NONREACTIVE

## 2019-09-24 ENCOUNTER — Telehealth: Payer: Self-pay

## 2019-09-24 NOTE — Telephone Encounter (Signed)
Pt is a [facility] pt

## 2019-09-24 NOTE — Telephone Encounter (Signed)
  New message    Mom calls have the focus plan insurance.  The daughter needs COVID 16 testing before she can go back to college.   Mom wants the MD to advise on where to go.   No [facility] visit No [facility] visit.

## 2019-09-25 NOTE — Telephone Encounter (Signed)
Called and was informed pt mother was looking for a testing location that was covered under insurance. Mother spoke to insurance provider and was informed that they could get tested anywhere.

## 2019-09-28 ENCOUNTER — Ambulatory Visit: Payer: No Typology Code available for payment source | Attending: Internal Medicine

## 2019-09-28 DIAGNOSIS — Z20822 Contact with and (suspected) exposure to covid-19: Secondary | ICD-10-CM

## 2019-09-29 LAB — NOVEL CORONAVIRUS, NAA: SARS-CoV-2, NAA: NOT DETECTED

## 2020-07-27 ENCOUNTER — Telehealth: Payer: Self-pay | Admitting: Internal Medicine

## 2020-07-27 NOTE — Telephone Encounter (Signed)
Patient closed her finger in a car door and it has blood coming out the bottom of her finger, patient denied going to urgent care and no available appointments today, wanted to call and see what she should do.

## 2021-05-11 ENCOUNTER — Other Ambulatory Visit (HOSPITAL_COMMUNITY): Payer: Self-pay

## 2021-05-11 ENCOUNTER — Other Ambulatory Visit (HOSPITAL_BASED_OUTPATIENT_CLINIC_OR_DEPARTMENT_OTHER): Payer: Self-pay

## 2021-05-11 ENCOUNTER — Telehealth (INDEPENDENT_AMBULATORY_CARE_PROVIDER_SITE_OTHER): Payer: No Typology Code available for payment source | Admitting: Family Medicine

## 2021-05-11 ENCOUNTER — Telehealth: Payer: Self-pay | Admitting: Internal Medicine

## 2021-05-11 ENCOUNTER — Encounter: Payer: Self-pay | Admitting: Family Medicine

## 2021-05-11 VITALS — Temp 99.4°F

## 2021-05-11 DIAGNOSIS — R6889 Other general symptoms and signs: Secondary | ICD-10-CM

## 2021-05-11 MED ORDER — BENZONATATE 100 MG PO CAPS
100.0000 mg | ORAL_CAPSULE | Freq: Three times a day (TID) | ORAL | 0 refills | Status: DC | PRN
  Filled 2021-05-11: qty 20, 7d supply, fill #0

## 2021-05-11 NOTE — Telephone Encounter (Signed)
Rx re-sent to [facility].

## 2021-05-11 NOTE — Patient Instructions (Addendum)
   ---------------------------------------------------------------------------------------------------------------------------      WORK SLIP:  Patient Beyonce Sawatzky,  08/12/2001, was seen for a medical visit today, 05/11/21 . Please excuse from work for a COVID like illness. We advise 10 days minimum from the onset of symptoms (05/08/21) PLUS 1 day of no fever and improved symptoms. Will defer to employer for a sooner return to work if she has a negative test and the fever has resolved for greater than 24 hours OR symptoms have resolved, it is greater than 5 days since the positive test and the patient can wear a high-quality, tight fitting mask such as N95 or KN95 at all times for an additional 5 days. Would also suggest COVID19 antigen testing is negative prior to return.  Sincerely: E-signature: Dr. Kriste Basque, DO [facility] Ph: [phone]   ------------------------------------------------------------------------------------------------------------------------------   HOME CARE TIPS:  [facility] L[facility] COVID19 testing information: [email or web address] [place] [phone] Most pharmacies also offer testing and home test kits. If the Covid19 test is positive, please make a prompt follow up visit with your primary care office or with [facility] to discuss treatment options. Treatments for Covid19 are best given early in the course of the illness.   -I sent the medication(s) we discussed to your pharmacy: Meds ordered this encounter  Medications   benzonatate (TESSALON PERLES) 100 MG capsule    Sig: Take 1 capsule (100 mg total) by mouth 3 (three) times daily as needed.    Dispense:  20 capsule    Refill:  0     -can use tylenol or aleve if needed for fevers, aches and pains per instructions  -can use nasal saline a few times per day if you have nasal congestion; sometimes  a short course of Afrin nasal spray for 3 days can help  with symptoms as well  -stay hydrated, drink plenty of fluids and eat small healthy meals - avoid dairy  -can take 1000 IU ( ) Vit D3 and 100-500 mg of Vit C daily per instructions  -If the Covid test is positive, check out the [facility] website for more information on home care, transmission and treatment for COVID19  -follow up with your doctor in 2-3 days unless improving and feeling better  -stay home while sick, except to seek medical care. If you have COVID19, ideally it would be best to stay home for a full 10 days since the onset of symptoms PLUS one day of no fever and feeling better. Wear a good mask that fits snugly (such as N95 or KN95) if around others to reduce the risk of transmission.  It was nice to meet you today, and I really hope you are feeling better soon. I help [facility] out with telemedicine visits on Tuesdays and Thursdays and am available for visits on those days. If you have any concerns or questions following this visit please schedule a follow up visit with your Primary Care doctor or seek care at a local urgent care clinic to avoid delays in care.    Seek in person care or schedule a follow up video visit promptly if your symptoms worsen, the ear is not improving or worsens, new concerns arise or you are not improving with treatment. Call 911 and/or seek emergency care if your symptoms are severe or life threatening.

## 2021-05-11 NOTE — Telephone Encounter (Signed)
Re: benzonatate (TESSALON PERLES) 100 MG capsule  Patient requesting medication be sent to [facility] instead of [facility]

## 2021-05-11 NOTE — Progress Notes (Signed)
Virtual Visit via Video Note  I connected with Beverly Gonzales  on 05/11/21 at 11:20 AM EDT by a video enabled telemedicine application and verified that I am speaking with the correct person using two identifiers.  Location patient: home,  Location provider:work or home office Persons participating in the virtual visit: patient, provider  I discussed the limitations of evaluation and management by telemedicine and the availability of in person appointments. The patient expressed understanding and agreed to proceed.   HPI:  Acute telemedicine visit for flu like symptoms: -Onset: 3 days ago -Symptoms include: nasal congestion, cough, ear pain, cough, fever 101 yesterday and 99 today -Denies: CP, SOB, NVD, inability to eat/drink/get out of bed -Has tried: otc cough medication -Pertinent past medical history: see below -Pertinent medication allergies: No Known Allergies -denies any chance of pregnancy -COVID-19 vaccine status: not vaccinated  ROS: See pertinent positives and negatives per HPI.  Past Medical History:  Diagnosis Date   GERD (gastroesophageal reflux disease)     History reviewed. No pertinent surgical history.   Current Outpatient Medications:    benzonatate (TESSALON PERLES) 100 MG capsule, Take 1 capsule (100 mg total) by mouth 3 (three) times daily as needed., Disp: 20 capsule, Rfl: 0  EXAM:  VITALS per patient if applicable:  GENERAL: alert, oriented, appears well and in no acute distress  HEENT: atraumatic, conjunttiva clear, no obvious abnormalities on inspection of external nose and ears  NECK: normal movements of the head and neck  LUNGS: on inspection no signs of respiratory distress, breathing rate appears normal, no obvious gross SOB, gasping or wheezing  CV: no obvious cyanosis  MS: moves all visible extremities without noticeable abnormality  PSYCH/NEURO: pleasant and cooperative, no obvious depression or anxiety, speech and thought processing  grossly intact  ASSESSMENT AND PLAN:  Discussed the following assessment and plan:  Flu-like symptoms  -we discussed possible serious and likely etiologies, options for evaluation and workup, limitations of telemedicine visit vs in person visit, treatment, treatment risks and precautions. Pt is agreeable to treatment via telemedicine at this moment.  Discussed options for in person care at length given her ear discomfort, as I am unable to evaluate that via the video visit.  Discussed possibility of COVID-19, influenza versus other.  For now she is opted for symptomatic care with Tessalon Rx for cough, nasal saline, a short course of nasal decongestant and agrees to seek in person care if worsening or not improving.  She also agrees to repeat COVID testing.  Discussed the possibility of false negative early testing. Work/School slipped offered: provided in patient instructions  Advised to seek prompt in person care if worsening, new symptoms arise, or if is not improving with treatment. Discussed options for inperson care if PCP office not available. Did let this patient know that I only do telemedicine on Tuesdays and Thursdays for [facility]. Advised to schedule follow up visit with PCP or [facility] if any further questions or concerns to avoid delays in care.   I discussed the assessment and treatment plan with the patient. The patient was provided an opportunity to ask questions and all were answered. The patient agreed with the plan and demonstrated an understanding of the instructions.     Terressa Koyanagi, DO

## 2021-09-26 ENCOUNTER — Encounter: Payer: No Typology Code available for payment source | Admitting: Internal Medicine

## 2021-10-20 ENCOUNTER — Ambulatory Visit: Payer: No Typology Code available for payment source | Admitting: Nurse Practitioner

## 2021-12-19 NOTE — Progress Notes (Signed)
? ? ?Subjective:  ? ? Patient ID: Beverly Gonzales, female    DOB: [birth year], 21 y.o.   MRN: [id number] ? ?This visit occurred during the SARS-CoV-2 public health emergency.  Safety protocols were in place, including screening questions prior to the visit, additional usage of staff PPE, and extensive cleaning of exam room while observing appropriate contact time as indicated for disinfecting solutions. ? ? ? ?HPI ?Beverly Gonzales is here for  ?Chief Complaint  ?Patient presents with  ? Hemorrhoids  ?  Abdominal discomfort, bloating has noticed some blood with in bowel movements  ? ? ? ?? Hemorrhoids - brbpr while having BM these past 2 days.  On TP. Did not look at stool.  Never had blood previously.   ? ?Hard time pasing stool for a while.  Some lower back pain - not sure if that is related.   ? ?Had normal Bm's but not daily and around Jan 2022 things changed and she started to become more constipated.  It is not all the time - like every 2 weeks gets contpation. No change in diet, meds, fluid intake.  Graduated last may from college - working now.  Occupational psychologist.  ? ?Eats a lot of greens/salads.  Does not drink much water. No regular exercise.   ? ?Has to strain to have a BM.  Then has pain after.  Then has to lay down. She has small BM's when she has them.  She feels bloated and her abdomen is uncomfortable.  Rectal area is uncomfortable, some itch. ? ? ?No fam history of GI issues. . ? ? ?In 2019 she was in the ED for constipation.  CT Ab/P was done.  ? ? ?Medications and allergies reviewed with patient and updated if appropriate. ? ?No current outpatient medications on file prior to visit.  ? ?No current facility-administered medications on file prior to visit.  ? ? ?Review of Systems  ?Gastrointestinal:  Positive for abdominal distention, abdominal pain (central lower abdomen - when constipated), blood in stool and constipation. Negative for nausea.  ?     No gerd  ?Genitourinary:  Positive for frequency. Negative  for difficulty urinating, dysuria and hematuria.  ? ?   ?Objective:  ? ?Vitals:  ? 12/20/21 1426  ?BP: 102/80  ?Pulse: 81  ?Temp: 98.1 ?F (36.7 ?C)  ?SpO2: 98%  ? ?BP Readings from Last 3 Encounters:  ?12/20/21 102/80  ?05/26/18 102/72  ?05/02/18 100/75  ? ?Wt Readings from Last 3 Encounters:  ?12/20/21 134 lb (60.8 kg)  ?05/26/18 127 lb 12 oz (57.9 kg) (50 %, Z= 0.00)*  ?05/02/18 128 lb 15.5 oz (58.5 kg) (52 %, Z= 0.06)*  ? ?* Growth percentiles are based on CDC (Girls, 2-20 Years) data.  ? ?Body mass index is 20.8 kg/m?. ? ?  ?Physical Exam ?Constitutional:   ?   General: She is not in acute distress. ?   Appearance: Normal appearance.  ?HENT:  ?   Head: Normocephalic.  ?Abdominal:  ?   General: There is no distension.  ?   Palpations: Abdomen is soft. There is no mass.  ?   Tenderness: There is abdominal tenderness (diffuse tenderness). There is no guarding or rebound.  ?   Hernia: No hernia is present.  ?Genitourinary: ?   Comments: Rectal exam deferred by patient ?Musculoskeletal:     ?   General: Tenderness (sacrum) present.  ?Skin: ?   General: Skin is warm and dry.  ?Neurological:  ?   Mental  Status: She is alert.  ? ?   ? ? ? ?Lab Results  ?Component Value Date  ? WBC 5.6 05/26/2018  ? HGB 13.4 05/26/2018  ? HCT 38.6 05/26/2018  ? PLT 183.0 05/26/2018  ? GLUCOSE 92 05/26/2018  ? CHOL 150 05/26/2018  ? TRIG 99.0 05/26/2018  ? HDL 57.20 05/26/2018  ? LDLCALC 73 05/26/2018  ? ALT 11 05/26/2018  ? AST 10 05/26/2018  ? NA 140 05/26/2018  ? K 3.8 05/26/2018  ? CL 104 05/26/2018  ? CREATININE 0.77 05/26/2018  ? BUN 15 05/26/2018  ? CO2 29 05/26/2018  ? HGBA1C 4.7 05/26/2018  ? ?CT Abdomen Pelvis W Contrast ?CLINICAL DATA:  Right lower quadrant pain with nausea, vomiting and ?some constipation for 10 days. ? ?EXAM: ?CT ABDOMEN AND PELVIS WITH CONTRAST ? ?TECHNIQUE: ?Multidetector CT imaging of the abdomen and pelvis was performed ?using the standard protocol following bolus administration of ?intravenous  contrast. ? ?CONTRAST:  75mL ISOVUE-300 IOPAMIDOL (ISOVUE-300) INJECTION 61%, ? ISOVUE-300 IOPAMIDOL (ISOVUE-300) INJECTION 61% ? ?COMPARISON:  None. ? ?FINDINGS: ?Lower chest: Normal size heart. Clear lung bases. No pericardial ?effusion. ? ?Hepatobiliary: Mild periportal edema which can be seen in ?hypervolemia, hepatic congestion and hepatitis among some ?possibilities though not exclusive. Given distended appearance of ?the IVC, suspect hypervolemia. No space-occupying mass of the liver. ?Fatty infiltration along the falciform ligament is seen. Gallbladder ?is contracted and free of stones. ? ?Pancreas: Normal ? ?Spleen: Normal ? ?Adrenals/Urinary Tract: Normal bilateral adrenal glands and kidneys. ?Mild fullness of the renal pelves without hydroureteronephrosis. The ?urinary bladder is unremarkable. No bladder calculus or focal mural ?thickening. ? ?Stomach/Bowel: Normal appearing appendix is visualized. The stomach ?is somewhat distended with ingested food and contrast. The duodenal ?sweep and ligament of Treitz is well small bowel are unremarkable. ?Increased fecal retention is seen within the colon. ? ?Vascular/Lymphatic: No significant vascular findings are present. No ?enlarged abdominal or pelvic lymph nodes. ? ?Reproductive: Uterus is retroverted and retroflexed. No adnexal ?mass. Physiologic sized follicles are identified with dominant ?follicle noted on the left measuring up to 2 cm. ? ?Other: No free air nor free fluid. ? ?Musculoskeletal: No acute or significant osseous findings. ? ?IMPRESSION: ?1. No findings for the patient's right lower quadrant pain apart ?from increased fecal retention within the colon. The appendix is ?normal in appearance. No obstructive uropathy is seen. ?2. Mild periportal edema is nonspecific but can be seen in ?hypervolemic state, hepatitis or hepatic congestion among some ?possibilities. Suspect hypervolemia given distended IVC. ? ?Electronically Signed ?  By: Tollie Eth M.D. ?  On: 05/02/2018 23:09 ? ? ? ?Assessment & Plan:  ? ? ?Chronic constipation, BRBPR: ?Constipation is chronic, BRBPR is new x 2 days and likely related to constipation ?She did go to ED in 2019 for similar CT A/P did not reveal any concerns and symptoms improved with stool softeners, but she did not continue these ?Restart stool softener daily - may need to titrate up dose ?Discussed other things she can try otc ?Increase water intake ?Deferred prescription steroid cream for hemorrhoids - ok to use otc meds, can try sitz baths ?Stressed improving regularly of stools and getting stools softer to help with preventing hemorrhoids ?Referred to GI given severity of constipation ?She deferred xray  ? ?Sacral back pain: ?new ?? Related to constipation or not  ?Deferred xray  ?Symptomatic treatment ?If no improvement needs further evaluation ? ? ? ?

## 2021-12-20 ENCOUNTER — Encounter: Payer: Self-pay | Admitting: Internal Medicine

## 2021-12-20 ENCOUNTER — Ambulatory Visit (INDEPENDENT_AMBULATORY_CARE_PROVIDER_SITE_OTHER): Payer: No Typology Code available for payment source | Admitting: Internal Medicine

## 2021-12-20 VITALS — BP 102/80 | HR 81 | Temp 98.1°F | Ht 67.3 in | Wt 134.0 lb

## 2021-12-20 DIAGNOSIS — K625 Hemorrhage of anus and rectum: Secondary | ICD-10-CM | POA: Diagnosis not present

## 2021-12-20 DIAGNOSIS — K5909 Other constipation: Secondary | ICD-10-CM | POA: Diagnosis not present

## 2021-12-20 DIAGNOSIS — M533 Sacrococcygeal disorders, not elsewhere classified: Secondary | ICD-10-CM

## 2021-12-20 NOTE — Patient Instructions (Addendum)
? ?  Medications changes include :   start a stool softener - take 2 pills daily - titrate if needed.   You can try taking metamucil daily.  You can take miralax daily as needed too for severe constipation.   ? ?Increase your water.  ? ?You can also try magnesium daily, dried fruit, smooth move tea and probiotics.   ? ? ?A referral was ordered for GI.     Someone from that office will call you to schedule an appointment.  ? ? ? ?Constipation, Adult ?Constipation is when a person has fewer than three bowel movements in a week, has difficulty having a bowel movement, or has stools (feces) that are dry, hard, or larger than normal. Constipation may be caused by an underlying condition. It may become worse with age if a person takes certain medicines and does not take in enough fluids. ?Follow these instructions at home: ?Eating and drinking ? ?Eat foods that have a lot of fiber, such as beans, whole grains, and fresh fruits and vegetables. ?Limit foods that are low in fiber and high in fat and processed sugars, such as fried or sweet foods. These include french fries, hamburgers, cookies, candies, and soda. ?Drink enough fluid to keep your urine pale yellow. ?General instructions ?Exercise regularly or as told by your health care provider. Try to do 150 minutes of moderate exercise each week. ?Use the bathroom when you have the urge to go. Do not hold it in. ?Take over-the-counter and prescription medicines only as told by your health care provider. This includes any fiber supplements. ?During bowel movements: ?Practice deep breathing while relaxing the lower abdomen. ?Practice pelvic floor relaxation. ?Watch your condition for any changes. Let your health care provider know about them. ?Keep all follow-up visits as told by your health care provider. This is important. ?Contact a health care provider if: ?You have pain that gets worse. ?You have a fever. ?You do not have a bowel movement after 4 days. ?You vomit. ?You  are not hungry or you lose weight. ?You are bleeding from the opening between the buttocks (anus). ?You have thin, pencil-like stools. ?Get help right away if: ?You have a fever and your symptoms suddenly get worse. ?You leak stool or have blood in your stool. ?Your abdomen is bloated. ?You have severe pain in your abdomen. ?You feel dizzy or you faint. ?Summary ?Constipation is when a person has fewer than three bowel movements in a week, has difficulty having a bowel movement, or has stools (feces) that are dry, hard, or larger than normal. ?Eat foods that have a lot of fiber, such as beans, whole grains, and fresh fruits and vegetables. ?Drink enough fluid to keep your urine pale yellow. ?Take over-the-counter and prescription medicines only as told by your health care provider. This includes any fiber supplements. ?This information is not intended to replace advice given to you by your health care provider. Make sure you discuss any questions you have with your health care provider. ?Document Revised: 07/08/2019 Document Reviewed: 07/08/2019 ?Elsevier Patient Education ? 2023 [employer] ? ?

## 2022-02-15 ENCOUNTER — Other Ambulatory Visit (HOSPITAL_BASED_OUTPATIENT_CLINIC_OR_DEPARTMENT_OTHER): Payer: Self-pay

## 2022-02-15 ENCOUNTER — Ambulatory Visit (INDEPENDENT_AMBULATORY_CARE_PROVIDER_SITE_OTHER): Payer: No Typology Code available for payment source | Admitting: Internal Medicine

## 2022-02-15 ENCOUNTER — Encounter: Payer: Self-pay | Admitting: Internal Medicine

## 2022-02-15 ENCOUNTER — Ambulatory Visit (INDEPENDENT_AMBULATORY_CARE_PROVIDER_SITE_OTHER): Payer: No Typology Code available for payment source

## 2022-02-15 VITALS — BP 104/60 | HR 69 | Temp 98.2°F | Resp 16 | Ht 67.3 in | Wt 132.0 lb

## 2022-02-15 DIAGNOSIS — K581 Irritable bowel syndrome with constipation: Secondary | ICD-10-CM

## 2022-02-15 DIAGNOSIS — R10817 Generalized abdominal tenderness: Secondary | ICD-10-CM

## 2022-02-15 DIAGNOSIS — Z0000 Encounter for general adult medical examination without abnormal findings: Secondary | ICD-10-CM | POA: Diagnosis not present

## 2022-02-15 DIAGNOSIS — K5909 Other constipation: Secondary | ICD-10-CM

## 2022-02-15 DIAGNOSIS — Z0001 Encounter for general adult medical examination with abnormal findings: Secondary | ICD-10-CM

## 2022-02-15 LAB — HEPATIC FUNCTION PANEL
ALT: 22 U/L (ref 0–35)
AST: 19 U/L (ref 0–37)
Albumin: 4.7 g/dL (ref 3.5–5.2)
Alkaline Phosphatase: 36 U/L — ABNORMAL LOW (ref 39–117)
Bilirubin, Direct: 0.2 mg/dL (ref 0.0–0.3)
Total Bilirubin: 1.1 mg/dL (ref 0.2–1.2)
Total Protein: 7.1 g/dL (ref 6.0–8.3)

## 2022-02-15 LAB — BASIC METABOLIC PANEL
BUN: 13 mg/dL (ref 6–23)
CO2: 28 mEq/L (ref 19–32)
Calcium: 9.7 mg/dL (ref 8.4–10.5)
Chloride: 104 mEq/L (ref 96–112)
Creatinine, Ser: 0.76 mg/dL (ref 0.40–1.20)
GFR: 110.49 mL/min (ref >60.00)
Glucose, Bld: 85 mg/dL (ref 70–99)
Potassium: 4.2 mEq/L (ref 3.5–5.1)
Sodium: 139 mEq/L (ref 135–145)

## 2022-02-15 LAB — CBC WITH DIFFERENTIAL/PLATELET
Basophils Absolute: 0.1 10*3/uL (ref 0.0–0.1)
Basophils Relative: 1 % (ref 0.0–3.0)
Eosinophils Absolute: 0.1 10*3/uL (ref 0.0–0.7)
Eosinophils Relative: 1.3 % (ref 0.0–5.0)
HCT: 39.3 % (ref 36.0–46.0)
Hemoglobin: 13.6 g/dL (ref 12.0–15.0)
Lymphocytes Relative: 24.9 % (ref 12.0–46.0)
Lymphs Abs: 1.3 10*3/uL (ref 0.7–4.0)
MCHC: 34.7 g/dL (ref 30.0–36.0)
MCV: 87 fl (ref 78.0–100.0)
Monocytes Absolute: 0.4 10*3/uL (ref 0.1–1.0)
Monocytes Relative: 7.6 % (ref 3.0–12.0)
Neutro Abs: 3.5 10*3/uL (ref 1.4–7.7)
Neutrophils Relative %: 65.2 % (ref 43.0–77.0)
Platelets: 167 10*3/uL (ref 150.0–400.0)
RBC: 4.51 Mil/uL (ref 3.87–5.11)
RDW: 13.1 % (ref 11.5–15.5)
WBC: 5.3 10*3/uL (ref 4.0–10.5)

## 2022-02-15 LAB — HCG, QUANTITATIVE, PREGNANCY: Quantitative HCG: <0.6 m[IU]/mL

## 2022-02-15 LAB — TSH: TSH: 1.39 u[IU]/mL (ref 0.35–5.50)

## 2022-02-15 LAB — LIPASE: Lipase: 25 U/L (ref 11.0–59.0)

## 2022-02-15 MED ORDER — LUBIPROSTONE 8 MCG PO CAPS
8.0000 ug | ORAL_CAPSULE | Freq: Two times a day (BID) | ORAL | 1 refills | Status: DC
  Filled 2022-02-15: qty 180, 90d supply, fill #0

## 2022-02-15 NOTE — Patient Instructions (Signed)

## 2022-02-15 NOTE — Progress Notes (Signed)
Subjective:  Patient ID: Beverly Gonzales, female    DOB: 05/04/2001  Age: 21 y.o. MRN: [id number]  CC: Annual Exam and Abdominal Pain   HPI Beverly Gonzales presents for a CPX and to establish.  She complains of a 1 year history of constipation and intermittent crampy abdominal pain.  The abdominal pain gets better after a bowel movement.  She has not gotten any symptom relief with a stool softener.  She denies nausea, vomiting, loss of appetite, weight loss, melena, or bright red blood per rectum.  History Beverly Gonzales has a past medical history of GERD (gastroesophageal reflux disease).   She has no past surgical history on file.   Her family history is not on file.She reports that she has never smoked. She has never used smokeless tobacco. She reports that she does not drink alcohol and does not use drugs.  No outpatient medications prior to visit.   No facility-administered medications prior to visit.    ROS Review of Systems  Constitutional:  Negative for appetite change, diaphoresis, fatigue and unexpected weight change.  HENT: Negative.    Eyes: Negative.   Respiratory:  Negative for cough, chest tightness, shortness of breath and wheezing.   Cardiovascular:  Negative for chest pain, palpitations and leg swelling.  Gastrointestinal:  Positive for abdominal pain and constipation. Negative for blood in stool, diarrhea, nausea and vomiting.  Endocrine: Negative.   Genitourinary: Negative.  Negative for difficulty urinating.  Musculoskeletal: Negative.   Skin: Negative.   Neurological: Negative.  Negative for dizziness, weakness and headaches.  Hematological:  Negative for adenopathy. Does not bruise/bleed easily.  Psychiatric/Behavioral: Negative.      Objective:  BP 104/60 (BP Location: Right Arm, Patient Position: Sitting, Cuff Size: Large)   Pulse 69   Temp 98.2 F (36.8 C) (Oral)   Resp 16   Ht 5' 7.3" (1.709 m)   Wt 132 lb (59.9 kg)   LMP 01/25/2022 (Exact Date)   SpO2  99%   BMI 20.49 kg/m   Physical Exam Vitals reviewed.  Constitutional:      Appearance: She is not ill-appearing.  HENT:     Nose: Nose normal.     Mouth/Throat:     Mouth: Mucous membranes are moist.  Eyes:     General: No scleral icterus.    Conjunctiva/sclera: Conjunctivae normal.  Cardiovascular:     Rate and Rhythm: Normal rate and regular rhythm.     Heart sounds: No murmur heard. Pulmonary:     Effort: Pulmonary effort is normal.     Breath sounds: No stridor. No wheezing, rhonchi or rales.  Abdominal:     General: Abdomen is flat. Bowel sounds are normal. There is no distension.     Palpations: Abdomen is soft. There is no hepatomegaly, splenomegaly or mass.     Tenderness: There is generalized abdominal tenderness. There is no guarding or rebound.     Hernia: No hernia is present.  Musculoskeletal:        General: Normal range of motion.     Cervical back: Neck supple.     Right lower leg: No edema.     Left lower leg: No edema.  Lymphadenopathy:     Cervical: No cervical adenopathy.  Skin:    General: Skin is warm and dry.  Neurological:     General: No focal deficit present.     Mental Status: She is alert and oriented to person, place, and time.  Psychiatric:  Mood and Affect: Mood normal.        Behavior: Behavior normal.     Lab Results  Component Value Date   WBC 5.3 02/15/2022   HGB 13.6 02/15/2022   HCT 39.3 02/15/2022   PLT 167.0 02/15/2022   GLUCOSE 85 02/15/2022   CHOL 150 05/26/2018   TRIG 99.0 05/26/2018   HDL 57.20 05/26/2018   LDLCALC 73 05/26/2018   ALT 22 02/15/2022   AST 19 02/15/2022   NA 139 02/15/2022   K 4.2 02/15/2022   CL 104 02/15/2022   CREATININE 0.76 02/15/2022   BUN 13 02/15/2022   CO2 28 02/15/2022   TSH 1.39 02/15/2022   HGBA1C 4.7 05/26/2018    DG ABD ACUTE 2+V W 1V CHEST  Result Date: 02/15/2022 CLINICAL DATA:  21 year old female with history of abdominal tenderness and constipation for 1 year. EXAM:  DG ABDOMEN ACUTE WITH 1 VIEW CHEST COMPARISON:  Acute abdominal series 09/09/2014. FINDINGS: Lung volumes are normal. No consolidative airspace disease. No pleural effusions. No pneumothorax. No pulmonary nodule or mass noted. Pulmonary vasculature and the cardiomediastinal silhouette are within normal limits. Gas and stool are seen scattered throughout the colon extending to the level of the distal rectum. No pathologic distension of small bowel is noted. No gross evidence of pneumoperitoneum. IMPRESSION: 1. Nonobstructive bowel gas pattern. 2. No pneumoperitoneum. 3. No radiographic evidence of acute cardiopulmonary disease. Electronically Signed   By: Trudie Reed M.D.   On: 02/15/2022 08:51     Assessment & Plan:   Beverly Gonzales was seen today for annual exam and abdominal pain.  Diagnoses and all orders for this visit:  Generalized abdominal tenderness without rebound tenderness- Her labs and xray are normal. -     Basic metabolic panel; Future -     Lipase; Future -     CBC with Differential/Platelet; Future -     Hepatic function panel; Future -     hCG, quantitative, pregnancy; Future -     DG ABD ACUTE 2+V W 1V CHEST; Future -     hCG, quantitative, pregnancy -     Hepatic function panel -     CBC with Differential/Platelet -     Lipase -     Basic metabolic panel -     lubiprostone (AMITIZA) 8 MCG capsule; Take 1 capsule (8 mcg total) by mouth 2 (two) times daily with a meal.  Chronic constipation- Labs are normal. Will treat with amitiza. -     Basic metabolic panel; Future -     TSH; Future -     DG ABD ACUTE 2+V W 1V CHEST; Future -     TSH -     Basic metabolic panel -     lubiprostone (AMITIZA) 8 MCG capsule; Take 1 capsule (8 mcg total) by mouth 2 (two) times daily with a meal.  Encounter for general adult medical examination with abnormal findings- Exam completed, vaccines are UTD, PAP not indicated, pt ed material was given.  Irritable bowel syndrome with constipation -      lubiprostone (AMITIZA) 8 MCG capsule; Take 1 capsule (8 mcg total) by mouth 2 (two) times daily with a meal.   I am having Beverly Gonzales start on lubiprostone.  Meds ordered this encounter  Medications   lubiprostone (AMITIZA) 8 MCG capsule    Sig: Take 1 capsule (8 mcg total) by mouth 2 (two) times daily with a meal.    Dispense:  180 capsule  Refill:  1     Follow-up: Return in about 6 months (around 08/17/2022).  Sanda Linger, MD

## 2022-02-16 ENCOUNTER — Other Ambulatory Visit (HOSPITAL_BASED_OUTPATIENT_CLINIC_OR_DEPARTMENT_OTHER): Payer: Self-pay

## 2022-02-19 ENCOUNTER — Other Ambulatory Visit (HOSPITAL_BASED_OUTPATIENT_CLINIC_OR_DEPARTMENT_OTHER): Payer: Self-pay

## 2022-02-26 ENCOUNTER — Other Ambulatory Visit (HOSPITAL_BASED_OUTPATIENT_CLINIC_OR_DEPARTMENT_OTHER): Payer: Self-pay

## 2022-12-06 ENCOUNTER — Encounter: Payer: Self-pay | Admitting: Internal Medicine

## 2022-12-06 ENCOUNTER — Other Ambulatory Visit (HOSPITAL_BASED_OUTPATIENT_CLINIC_OR_DEPARTMENT_OTHER): Payer: Self-pay

## 2022-12-06 ENCOUNTER — Ambulatory Visit (INDEPENDENT_AMBULATORY_CARE_PROVIDER_SITE_OTHER): Payer: 59 | Admitting: Internal Medicine

## 2022-12-06 VITALS — BP 126/74 | HR 78 | Temp 98.6°F | Ht 67.3 in | Wt 127.0 lb

## 2022-12-06 DIAGNOSIS — R109 Unspecified abdominal pain: Secondary | ICD-10-CM

## 2022-12-06 DIAGNOSIS — R197 Diarrhea, unspecified: Secondary | ICD-10-CM | POA: Diagnosis not present

## 2022-12-06 MED ORDER — DIPHENOXYLATE-ATROPINE 2.5-0.025 MG PO TABS
1.0000 | ORAL_TABLET | Freq: Four times a day (QID) | ORAL | 1 refills | Status: DC | PRN
  Filled 2022-12-06: qty 30, 8d supply, fill #0

## 2022-12-06 MED ORDER — ONDANSETRON 4 MG PO TBDP
4.0000 mg | ORAL_TABLET | Freq: Three times a day (TID) | ORAL | 0 refills | Status: DC | PRN
  Filled 2022-12-06: qty 30, 10d supply, fill #0

## 2022-12-06 NOTE — Patient Instructions (Addendum)
Please take all new medication as prescribed - the nausea medication, and diarrhea medication as needed  Please continue all other medications as before, and refills have been done if requested.  Please have the pharmacy call with any other refills you may need.  Please keep your appointments with your specialists as you may have planned  Please go to the LAB at the blood drawing area for the tests to be done  You will be contacted by phone if any changes need to be made immediately.  Otherwise, you will receive a letter about your results with an explanation, but please check with MyChart first.  Please remember to sign up for MyChart if you have not done so, as this will be important to you in the future with finding out test results, communicating by private email, and scheduling acute appointments online when needed.

## 2022-12-06 NOTE — Assessment & Plan Note (Signed)
With mild gend abd tenderness on exam , overall I supsect viral illness at the outset with residual GI tenderness and diarrhea probably improving; now for zofran prn, lomotil prn and labs including cbc, bmp, UA; and consider CT and/or GI panel or GI referral for persistence or worsening s/s or leukocytosis

## 2022-12-06 NOTE — Progress Notes (Signed)
Patient ID: Beverly Gonzales, female   DOB: [birth year], 22 y.o.   MRN: [id number]        Chief Complaint: follow up diarrhea, abd soreness       HPI:  Beverly Gonzales is a 22 y.o. female here with c/o 2 wks onset generalized abd discomfort, nausea (non vomiting) and daily multiple episodes loose and watery stools . May have had fever on day 1, then otherwise no fever, chills, and diarrhea may actually have improved in last 2-3 days in freq and severity.  Denies worsening reflux, dysphagia, other bowel change or blood.  No prior hx of same, no hx of need for EGD or colonoscopy.  No sick contacts she is aware.  Has had lower appetite and lost several labs, has had to use Boost much of the time for nutrition.  Not pregnant, last menses veyr recent.  Pt denies chest pain, increased sob or doe, wheezing, orthopnea, PND, increased LE swelling, palpitations, dizziness or syncope.  Pt denies polydipsia, polyuria, or new focal neuro s/s.  Has ongoing mild stress at work at times but has actually less stress in the past month, and no other significant anxiety or depression.         Wt Readings from Last 3 Encounters:  12/06/22 127 lb (57.6 kg)  02/15/22 132 lb (59.9 kg)  12/20/21 134 lb (60.8 kg)   BP Readings from Last 3 Encounters:  12/06/22 126/74  02/15/22 104/60  12/20/21 102/80         Past Medical History:  Diagnosis Date   GERD (gastroesophageal reflux disease)    History reviewed. No pertinent surgical history.  reports that she has never smoked. She has never used smokeless tobacco. She reports that she does not drink alcohol and does not use drugs. family history is not on file. No Known Allergies Current Outpatient Medications on File Prior to Visit  Medication Sig Dispense Refill   lubiprostone (AMITIZA) 8 MCG capsule Take 1 capsule (8 mcg total) by mouth 2 (two) times daily with a meal. 180 capsule 1   No current facility-administered medications on file prior to visit.        ROS:  All  others reviewed and negative.  Objective        PE:  BP 126/74   Pulse 78   Temp 98.6 F (37 C) (Oral)   Ht 5' 7.3" (1.709 m)   Wt 127 lb (57.6 kg)   SpO2 93%   BMI 19.71 kg/m                 Constitutional: Pt appears in NAD, fatigued, thin for height, non toxic               HENT: Head: NCAT.                Right Ear: External ear normal.                 Left Ear: External ear normal.  Bilat tm's with mild erythema.  Max sinus areas non tender.  Pharynx with mild erythema, no exudate               Eyes: . Pupils are equal, round, and reactive to light. Conjunctivae and EOM are normal               Nose: without d/c or deformity               Neck: Neck supple. Gross normal ROM,  also with few small nontender bilateral submandibular LA.,                 Cardiovascular: Normal rate and regular rhythm.                 Pulmonary/Chest: Effort normal and breath sounds without rales or wheezing.                Abd:  Soft, diffuse mild tender, no guarding or rebound, ND, + BS, no organomegaly               Neurological: Pt is alert. At baseline orientation, motor grossly intact               Skin: Skin is warm. No rashes, no other new lesions, LE edema - none               Psychiatric: Pt behavior is normal without agitation   Micro: none  Cardiac tracings I have personally interpreted today:  none  Pertinent Radiological findings (summarize): none   Lab Results  Component Value Date   WBC 5.3 02/15/2022   HGB 13.6 02/15/2022   HCT 39.3 02/15/2022   PLT 167.0 02/15/2022   GLUCOSE 85 02/15/2022   CHOL 150 05/26/2018   TRIG 99.0 05/26/2018   HDL 57.20 05/26/2018   LDLCALC 73 05/26/2018   ALT 22 02/15/2022   AST 19 02/15/2022   NA 139 02/15/2022   K 4.2 02/15/2022   CL 104 02/15/2022   CREATININE 0.76 02/15/2022   BUN 13 02/15/2022   CO2 28 02/15/2022   TSH 1.39 02/15/2022   HGBA1C 4.7 05/26/2018   Assessment/Plan:  Beverly Gonzales is a 22 y.o. White or Caucasian [1]  female with  has a past medical history of GERD (gastroesophageal reflux disease).  Diarrhea With mild gend abd tenderness on exam , overall I supsect viral illness at the outset with residual GI tenderness and diarrhea probably improving; now for zofran prn, lomotil prn and labs including cbc, bmp, UA; and consider CT and/or GI panel or GI referral for persistence or worsening s/s or leukocytosis   Followup: Return if symptoms worsen or fail to improve.  Beverly Cower, MD 12/06/2022 2:25 PM [facility]

## 2022-12-07 ENCOUNTER — Other Ambulatory Visit: Payer: Self-pay

## 2022-12-13 ENCOUNTER — Other Ambulatory Visit (HOSPITAL_BASED_OUTPATIENT_CLINIC_OR_DEPARTMENT_OTHER): Payer: Self-pay

## 2023-06-14 ENCOUNTER — Encounter: Payer: Self-pay | Admitting: Internal Medicine

## 2023-06-14 ENCOUNTER — Ambulatory Visit: Payer: 59 | Admitting: Internal Medicine

## 2023-06-14 ENCOUNTER — Other Ambulatory Visit (HOSPITAL_BASED_OUTPATIENT_CLINIC_OR_DEPARTMENT_OTHER): Payer: Self-pay

## 2023-06-14 ENCOUNTER — Other Ambulatory Visit (HOSPITAL_COMMUNITY): Payer: Self-pay

## 2023-06-14 VITALS — BP 108/72 | HR 77 | Temp 98.1°F | Ht 67.3 in | Wt 140.0 lb

## 2023-06-14 DIAGNOSIS — H6592 Unspecified nonsuppurative otitis media, left ear: Secondary | ICD-10-CM | POA: Diagnosis not present

## 2023-06-14 DIAGNOSIS — J069 Acute upper respiratory infection, unspecified: Secondary | ICD-10-CM

## 2023-06-14 DIAGNOSIS — H6993 Unspecified Eustachian tube disorder, bilateral: Secondary | ICD-10-CM | POA: Diagnosis not present

## 2023-06-14 MED ORDER — AMOXICILLIN-POT CLAVULANATE 875-125 MG PO TABS
1.0000 | ORAL_TABLET | Freq: Two times a day (BID) | ORAL | 0 refills | Status: AC
  Filled 2023-06-14 (×2): qty 14, 7d supply, fill #0

## 2023-06-14 MED ORDER — FLUTICASONE PROPIONATE 50 MCG/ACT NA SUSP
2.0000 | Freq: Every day | NASAL | 0 refills | Status: DC
  Filled 2023-06-14: qty 16, 30d supply, fill #0

## 2023-06-14 NOTE — Progress Notes (Addendum)
Subjective:    Patient ID: Beverly Gonzales, female    DOB: 2001/05/22, 22 y.o.   MRN: [id number]      HPI Avina is here for  Chief Complaint  Patient presents with   Cough    Cough, sore throat x 4 days of feeling bad,body aches and chills    She is here for an acute visit for cold symptoms.   Her symptoms started 3 days ago.  She is experiencing decreased appetite, fever, chills, nasal congestion, postnasal drip, sinus pressure, sore throat, cough and wheezing when coughing.  She also states ear pressure-she just flew back from [place].  She has tried taking dayquil, advil      Medications and allergies reviewed with patient and updated if appropriate.  Current Outpatient Medications on File Prior to Visit  Medication Sig Dispense Refill   diphenoxylate-atropine (LOMOTIL) 2.5-0.025 MG tablet Take 1 tablet by mouth 4 (four) times daily as needed for diarrhea or loose stools. 30 tablet 1   lubiprostone (AMITIZA) 8 MCG capsule Take 1 capsule (8 mcg total) by mouth 2 (two) times daily with a meal. 180 capsule 1   ondansetron (ZOFRAN-ODT) 4 MG disintegrating tablet Take 1 tablet (4 mg total) by mouth every 8 (eight) hours as needed for nausea or vomiting. 30 tablet 0   No current facility-administered medications on file prior to visit.    Review of Systems  Constitutional:  Positive for appetite change, chills and fever.  HENT:  Positive for congestion, postnasal drip, sinus pressure and sore throat. Negative for ear pain (ear popping, pressure - just flew).   Respiratory:  Positive for cough and wheezing (when coughing at times). Negative for shortness of breath.   Gastrointestinal:  Negative for diarrhea and nausea.  Musculoskeletal:  Positive for myalgias (mild).  Neurological:  Negative for light-headedness and headaches (improved).       Objective:   Vitals:   06/14/23 1411  BP: 108/72  Pulse: 77  Temp: 98.1 F (36.7 C)  SpO2: 97%   BP Readings from  Last 3 Encounters:  06/14/23 108/72  12/06/22 126/74  02/15/22 104/60   Wt Readings from Last 3 Encounters:  06/14/23 140 lb (63.5 kg)  12/06/22 127 lb (57.6 kg)  02/15/22 132 lb (59.9 kg)   Body mass index is 21.73 kg/m.    Physical Exam Constitutional:      General: She is not in acute distress.    Appearance: Normal appearance. She is not ill-appearing.  HENT:     Head: Normocephalic and atraumatic.     Right Ear: Tympanic membrane, ear canal and external ear normal.     Left Ear: Ear canal and external ear normal.     Ears:     Comments: Left TM erythematous with effusion    Mouth/Throat:     Mouth: Mucous membranes are moist.     Pharynx: No oropharyngeal exudate or posterior oropharyngeal erythema.  Eyes:     Conjunctiva/sclera: Conjunctivae normal.  Cardiovascular:     Rate and Rhythm: Normal rate and regular rhythm.     Heart sounds: Normal heart sounds.  Pulmonary:     Effort: Pulmonary effort is normal. No respiratory distress.     Breath sounds: Normal breath sounds. No wheezing or rales.  Musculoskeletal:     Cervical back: Neck supple. No tenderness.     Right lower leg: No edema.     Left lower leg: No edema.  Lymphadenopathy:  Cervical: No cervical adenopathy.  Skin:    General: Skin is warm and dry.     Findings: No rash.  Neurological:     Mental Status: She is alert.            Assessment & Plan:    URI, otitis media left ear, eustachian tube dysfunction Acute Symptoms consistent with URI-difficult to know if this is viral or bacterial COVID, flu and RSV test negative She does have evidence of left otitis media with eustachian tube dysfunction Start Augmentin 875-125 mg twice daily x 7 days, Flonase nasal spray daily If ears are not improving then start taking Sudafed Rest, fluids Call if no improvement

## 2023-06-14 NOTE — Patient Instructions (Addendum)
        Medications changes include :   Augmentin twice daily for one week, flonase nasal spray.  If your ear is still hurting after a few days - start taking sudafed     Return if symptoms worsen or fail to improve.

## 2023-09-25 ENCOUNTER — Encounter: Payer: 59 | Admitting: Internal Medicine

## 2023-11-05 ENCOUNTER — Encounter: Payer: 59 | Admitting: Internal Medicine

## 2023-11-12 ENCOUNTER — Telehealth: Admitting: Physician Assistant

## 2023-11-12 DIAGNOSIS — G4700 Insomnia, unspecified: Secondary | ICD-10-CM

## 2023-11-12 NOTE — Patient Instructions (Addendum)
 Beverly Gonzales, thank you for joining Beverly Loveless, PA-C for today's virtual visit.  While this provider is not your primary care provider (PCP), if your PCP is located in our provider database this encounter information will be shared with them immediately following your visit.   A [facility] MyChart account gives you access to today's visit and all your visits, tests, and labs performed at [facility] " click here if you don't have a [facility] MyChart account or go to [email or web address]  Consent: (Patient) Beverly Gonzales provided verbal consent for this virtual visit at the beginning of the encounter.  Current Medications:  Current Outpatient Medications:    diphenoxylate-atropine (LOMOTIL) 2.5-0.025 MG tablet, Take 1 tablet by mouth 4 (four) times daily as needed for diarrhea or loose stools., Disp: 30 tablet, Rfl: 1   fluticasone (FLONASE) 50 MCG/ACT nasal spray, Place 2 sprays into both nostrils daily., Disp: 16 g, Rfl: 0   lubiprostone (AMITIZA) 8 MCG capsule, Take 1 capsule (8 mcg total) by mouth 2 (two) times daily with a meal., Disp: 180 capsule, Rfl: 1   ondansetron (ZOFRAN-ODT) 4 MG disintegrating tablet, Take 1 tablet (4 mg total) by mouth every 8 (eight) hours as needed for nausea or vomiting., Disp: 30 tablet, Rfl: 0   Medications ordered in this encounter:  No orders of the defined types were placed in this encounter.    *If you need refills on other medications prior to your next appointment, please contact your pharmacy*  Follow-Up: Call back or seek an in-person evaluation if the symptoms worsen or if the condition fails to improve as anticipated.  [facility] [phone]  Other Instructions  Insomnia Insomnia is a sleep disorder that makes it difficult to fall asleep or stay asleep. Insomnia can cause fatigue, low energy, difficulty concentrating, mood swings, and poor performance at work or school. There are three  different ways to classify insomnia: Difficulty falling asleep. Difficulty staying asleep. Waking up too early in the morning. Any type of insomnia can be long-term (chronic) or short-term (acute). Both are common. Short-term insomnia usually lasts for 3 months or less. Chronic insomnia occurs at least three times a week for longer than 3 months. What are the causes? Insomnia may be caused by another condition, situation, or substance, such as: Having certain mental health conditions, such as anxiety and depression. Using caffeine, alcohol, tobacco, or drugs. Having gastrointestinal conditions, such as gastroesophageal reflux disease (GERD). Having certain medical conditions. These include: Asthma. Alzheimer's disease. Stroke. Chronic pain. An overactive thyroid gland (hyperthyroidism). Other sleep disorders, such as restless legs syndrome and sleep apnea. Menopause. Sometimes, the cause of insomnia may not be known. What increases the risk? Risk factors for insomnia include: Gender. Females are affected more often than males. Age. Insomnia is more common as people get older. Stress and certain medical and mental health conditions. Lack of exercise. Having an irregular work schedule. This may include working night shifts and traveling between different time zones. What are the signs or symptoms? If you have insomnia, the main symptom is having trouble falling asleep or having trouble staying asleep. This may lead to other symptoms, such as: Feeling tired or having low energy. Feeling nervous about going to sleep. Not feeling rested in the morning. Having trouble concentrating. Feeling irritable, anxious, or depressed. How is this diagnosed? This condition may be diagnosed based on: Your symptoms and medical history. Your health care provider may ask about: Your sleep habits. Any medical conditions  you have. Your mental health. A physical exam. How is this treated? Treatment  for insomnia depends on the cause. Treatment may focus on treating an underlying condition that is causing the insomnia. Treatment may also include: Medicines to help you sleep. Counseling or therapy. Lifestyle adjustments to help you sleep better. Follow these instructions at home: Eating and drinking  Limit or avoid alcohol, caffeinated beverages, and products that contain nicotine and tobacco, especially close to bedtime. These can disrupt your sleep. Do not eat a large meal or eat spicy foods right before bedtime. This can lead to digestive discomfort that can make it hard for you to sleep. Sleep habits  Keep a sleep diary to help you and your health care provider figure out what could be causing your insomnia. Write down: When you sleep. When you wake up during the night. How well you sleep and how rested you feel the next day. Any side effects of medicines you are taking. What you eat and drink. Make your bedroom a dark, comfortable place where it is easy to fall asleep. Put up shades or blackout curtains to block light from outside. Use a white noise machine to block noise. Keep the temperature cool. Limit screen use before bedtime. This includes: Not watching TV. Not using your smartphone, tablet, or computer. Stick to a routine that includes going to bed and waking up at the same times every day and night. This can help you fall asleep faster. Consider making a quiet activity, such as reading, part of your nighttime routine. Try to avoid taking naps during the day so that you sleep better at night. Get out of bed if you are still awake after 15 minutes of trying to sleep. Keep the lights down, but try reading or doing a quiet activity. When you feel sleepy, go back to bed. General instructions Take over-the-counter and prescription medicines only as told by your health care provider. Exercise regularly as told by your health care provider. However, avoid exercising in the hours  right before bedtime. Use relaxation techniques to manage stress. Ask your health care provider to suggest some techniques that may work well for you. These may include: Breathing exercises. Routines to release muscle tension. Visualizing peaceful scenes. Make sure that you drive carefully. Do not drive if you feel very sleepy. Keep all follow-up visits. This is important. Contact a health care provider if: You are tired throughout the day. You have trouble in your daily routine due to sleepiness. You continue to have sleep problems, or your sleep problems get worse. Get help right away if: You have thoughts about hurting yourself or someone else. Get help right away if you feel like you may hurt yourself or others, or have thoughts about taking your own life. Go to your nearest emergency room or: Call 911. Call the National Suicide Prevention Lifeline at 256-163-1436 or 988. This is open 24 hours a day. Text the Crisis Text Line at 867-477-1228. Summary Insomnia is a sleep disorder that makes it difficult to fall asleep or stay asleep. Insomnia can be long-term (chronic) or short-term (acute). Treatment for insomnia depends on the cause. Treatment may focus on treating an underlying condition that is causing the insomnia. Keep a sleep diary to help you and your health care provider figure out what could be causing your insomnia. This information is not intended to replace advice given to you by your health care provider. Make sure you discuss any questions you have with your health care  provider. Document Revised: 07/31/2021 Document Reviewed: 07/31/2021 Elsevier Patient Education  2024 Elsevier Inc.   If you have been instructed to have an in-person evaluation today at a local Urgent Care facility, please use the link below. It will take you to a list of all of our available [facility], including address, phone number and hours of operation. Please do not delay care.  [facility]  If you or a family member do not have a primary care provider, use the link below to schedule a visit and establish care. When you choose a [facility] primary care physician or advanced practice provider, you gain a long-term partner in health. Find a Primary Care Provider  Learn more about [facility] in-office and virtual care options: [facility] - Get Care Now

## 2023-11-12 NOTE — Progress Notes (Signed)
 Virtual Visit Consent   Beverly Gonzales, you are scheduled for a virtual visit with a [facility] provider today. Just as with appointments in the office, your consent must be obtained to participate. Your consent will be active for this visit and any virtual visit you may have with one of our providers in the next 365 days. If you have a MyChart account, a copy of this consent can be sent to you electronically.  As this is a virtual visit, video technology does not allow for your provider to perform a traditional examination. This may limit your provider's ability to fully assess your condition. If your provider identifies any concerns that need to be evaluated in person or the need to arrange testing (such as labs, EKG, etc.), we will make arrangements to do so. Although advances in technology are sophisticated, we cannot ensure that it will always work on either your end or our end. If the connection with a video visit is poor, the visit may have to be switched to a telephone visit. With either a video or telephone visit, we are not always able to ensure that we have a secure connection.  By engaging in this virtual visit, you consent to the provision of healthcare and authorize for your insurance to be billed (if applicable) for the services provided during this visit. Depending on your insurance coverage, you may receive a charge related to this service.  I need to obtain your verbal consent now. Are you willing to proceed with your visit today? Beverly Gonzales has provided verbal consent on 11/12/2023 for a virtual visit (video or telephone). Beverly Loveless, PA-C  Date: 11/12/2023 4:07 PM   Virtual Visit via Video Note   I, Beverly Gonzales, connected with  Beverly Gonzales  ([id number], 17-Aug-2001) on 11/12/23 at  4:15 PM EDT by a video-enabled telemedicine application and verified that I am speaking with the correct person using two identifiers.  Location: Patient: Virtual Visit Location  Patient: Home Provider: Virtual Visit Location Provider: Home Office   I discussed the limitations of evaluation and management by telemedicine and the availability of in person appointments. The patient expressed understanding and agreed to proceed.    History of Present Illness: Beverly Gonzales is a 23 y.o. who identifies as a female who was assigned female at birth, and is being seen today for insomnia and difficulty sleeping.   Reports a couple of weeks ago she went to [place] for a quick trip and then when she returned she had to go back into working. Worked all week, then went to [place] over the weekend to see her sister. Feels she has been maybe doing to much and now has poor sleep quality. Reports she is getting less than 4 hours of sleep per night since [place].   She has tried Melatonin and a nighty night tea that has not helped her symptoms.  She is having increased anxiety due to not sleeping well.   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    Allergies: No Known Allergies Medications:  Current Outpatient Medications:    diphenoxylate-atropine (LOMOTIL) 2.5-0.025 MG tablet, Take 1 tablet by mouth 4 (four) times daily as needed for diarrhea or loose stools., Disp:  30 tablet, Rfl: 1   fluticasone (FLONASE) 50 MCG/ACT nasal spray, Place 2 sprays into both nostrils daily., Disp: 16 g, Rfl: 0   lubiprostone (AMITIZA) 8 MCG capsule, Take 1 capsule (8 mcg total) by mouth 2 (two) times daily with a meal., Disp: 180 capsule, Rfl: 1   ondansetron (ZOFRAN-ODT) 4 MG disintegrating tablet, Take 1 tablet (4 mg total) by mouth every 8 (eight) hours as needed for nausea or vomiting., Disp: 30  tablet, Rfl: 0  Observations/Objective: Patient is well-developed, well-nourished in no acute distress.  Resting comfortably at home.  Head is normocephalic, atraumatic.  No labored breathing.  Speech is clear and coherent with logical content.  Patient is alert and oriented at baseline.    Assessment and Plan: There are no diagnoses linked to this encounter. - Advised to try Unisom OTC - Can use Unisom with Melatonin and sleepy time tea if needed - Discussed having a good sleep routine - Discussed sleep meditation techniques - Advised if symptoms do not improve over the next 48 hours to be seen in person for further evaluations  Follow Up Instructions: I discussed the assessment and treatment plan with the patient. The patient was provided an opportunity to ask questions and all were answered. The patient agreed with the plan and demonstrated an understanding of the instructions.  A copy of instructions were sent to the patient via MyChart unless otherwise noted below.    The patient was advised to call back or seek an in-person evaluation if the symptoms worsen or if the condition fails to improve as anticipated.    Beverly Loveless, PA-C

## 2023-11-13 ENCOUNTER — Telehealth: Payer: Self-pay | Admitting: Internal Medicine

## 2023-11-13 ENCOUNTER — Encounter: Payer: Self-pay | Admitting: Internal Medicine

## 2023-11-13 ENCOUNTER — Ambulatory Visit: Admitting: Internal Medicine

## 2023-11-13 VITALS — BP 100/80 | HR 92 | Temp 98.3°F | Ht 67.3 in | Wt 134.6 lb

## 2023-11-13 DIAGNOSIS — R10817 Generalized abdominal tenderness: Secondary | ICD-10-CM

## 2023-11-13 DIAGNOSIS — R5383 Other fatigue: Secondary | ICD-10-CM

## 2023-11-13 DIAGNOSIS — A09 Infectious gastroenteritis and colitis, unspecified: Secondary | ICD-10-CM

## 2023-11-13 LAB — CBC WITH DIFFERENTIAL/PLATELET
Basophils Absolute: 0.1 10*3/uL (ref 0.0–0.1)
Basophils Relative: 0.8 % (ref 0.0–3.0)
Eosinophils Absolute: 0 10*3/uL (ref 0.0–0.7)
Eosinophils Relative: 0.1 % (ref 0.0–5.0)
HCT: 40.6 % (ref 36.0–46.0)
Hemoglobin: 14.1 g/dL (ref 12.0–15.0)
Lymphocytes Relative: 19.5 % (ref 12.0–46.0)
Lymphs Abs: 2 10*3/uL (ref 0.7–4.0)
MCHC: 34.7 g/dL (ref 30.0–36.0)
MCV: 84.2 fl (ref 78.0–100.0)
Monocytes Absolute: 0.6 10*3/uL (ref 0.1–1.0)
Monocytes Relative: 6 % (ref 3.0–12.0)
Neutro Abs: 7.6 10*3/uL (ref 1.4–7.7)
Neutrophils Relative %: 73.6 % (ref 43.0–77.0)
Platelets: 330 10*3/uL (ref 150.0–400.0)
RBC: 4.83 Mil/uL (ref 3.87–5.11)
RDW: 12.6 % (ref 11.5–15.5)
WBC: 10.4 10*3/uL (ref 4.0–10.5)

## 2023-11-13 LAB — HEPATIC FUNCTION PANEL
ALT: 23 U/L (ref 0–35)
AST: 13 U/L (ref 0–37)
Albumin: 5.1 g/dL (ref 3.5–5.2)
Alkaline Phosphatase: 59 U/L (ref 39–117)
Bilirubin, Direct: 0.2 mg/dL (ref 0.0–0.3)
Total Bilirubin: 0.8 mg/dL (ref 0.2–1.2)
Total Protein: 7.8 g/dL (ref 6.0–8.3)

## 2023-11-13 LAB — LIPASE: Lipase: 22 U/L (ref 11.0–59.0)

## 2023-11-13 LAB — BASIC METABOLIC PANEL
BUN: 8 mg/dL (ref 6–23)
CO2: 28 meq/L (ref 19–32)
Calcium: 10 mg/dL (ref 8.4–10.5)
Chloride: 102 meq/L (ref 96–112)
Creatinine, Ser: 0.73 mg/dL (ref 0.40–1.20)
GFR: 114.55 mL/min (ref >60.00)
Glucose, Bld: 106 mg/dL — ABNORMAL HIGH (ref 70–99)
Potassium: 4.3 meq/L (ref 3.5–5.1)
Sodium: 138 meq/L (ref 135–145)

## 2023-11-13 LAB — C-REACTIVE PROTEIN: CRP: <1 mg/dL (ref 0.5–20.0)

## 2023-11-13 NOTE — Progress Notes (Addendum)
 Subjective:  Patient ID: Beverly Gonzales, female    DOB: [birth year]  Age: 23 y.o. MRN: [id number]  CC: Abdominal Pain   HPI Kyaira Trantham presents for f/up ----  Discussed the use of AI scribe software for clinical note transcription with the patient, who gave verbal consent to proceed.  History of Present Illness   The patient presents with fatigue, insomnia, and gastrointestinal symptoms following recent travel.  She recently returned from a three-day trip to [place], during which she experienced significant gastrointestinal symptoms, including a loss of appetite and persistent diarrhea described as 'a lot'. No abdominal pain, nausea, vomiting, or fever. She has not taken any medication for the diarrhea. She does not consume alcohol or smoke cigarettes.  She is experiencing fatigue and insomnia, which she attributes to her hectic travel schedule and lack of sleep during her trip to [place]. She barely slept due to attending a concert immediately after arrival and not sleeping on the plane. Upon returning, she went to work despite feeling unwell, experiencing dizziness and fatigue. She desires to sleep for extended periods to regain energy for work. She has been prescribed Unisom for insomnia during a virtual visit.  She began menstruating on Saturday night during a trip to [place] for her sister's birthday, which was associated with heavy bleeding. She reports feeling 'absolutely horrible' during this time. She is sexually active but not on any form of contraception.  Approximately ten days ago, she had symptoms of a respiratory infection, including cough, sore throat, and fever, which have since resolved. She experienced dizziness at work but not currently.       Outpatient Medications Prior to Visit  Medication Sig Dispense Refill   diphenoxylate-atropine (LOMOTIL) 2.5-0.025 MG tablet Take 1 tablet by mouth 4 (four) times daily as needed for diarrhea or loose stools. 30 tablet 1    fluticasone (FLONASE) 50 MCG/ACT nasal spray Place 2 sprays into both nostrils daily. 16 g 0   lubiprostone (AMITIZA) 8 MCG capsule Take 1 capsule (8 mcg total) by mouth 2 (two) times daily with a meal. 180 capsule 1   ondansetron (ZOFRAN-ODT) 4 MG disintegrating tablet Take 1 tablet (4 mg total) by mouth every 8 (eight) hours as needed for nausea or vomiting. 30 tablet 0   No facility-administered medications prior to visit.    ROS Review of Systems  Constitutional:  Positive for fatigue and unexpected weight change.  HENT:  Negative for sore throat and trouble swallowing.   Eyes: Negative.   Respiratory:  Negative for cough, chest tightness, shortness of breath and wheezing.   Cardiovascular:  Negative for chest pain, palpitations and leg swelling.  Gastrointestinal:  Positive for abdominal pain, diarrhea and nausea. Negative for anal bleeding, blood in stool, constipation and vomiting.  Genitourinary:  Positive for hematuria and menstrual problem. Negative for decreased urine volume, difficulty urinating, dyspareunia, dysuria, vaginal bleeding, vaginal discharge and vaginal pain.  Musculoskeletal: Negative.   Skin: Negative.   Neurological: Negative.  Negative for dizziness, weakness and light-headedness.  Hematological:  Negative for adenopathy. Does not bruise/bleed easily.  Psychiatric/Behavioral:  Positive for sleep disturbance. Negative for behavioral problems, confusion, decreased concentration and dysphoric mood. The patient is not nervous/anxious.     Objective:  BP 100/80 (BP Location: Left Arm, Patient Position: Sitting, Cuff Size: Small)   Pulse 92   Temp 98.3 F (36.8 C) (Oral)   Ht 5' 7.3" (1.709 m)   Wt 134 lb 9.6 oz (61.1 kg)   LMP 11/09/2023  SpO2 99%   BMI 20.89 kg/m   BP Readings from Last 3 Encounters:  11/13/23 100/80  06/14/23 108/72  12/06/22 126/74    Wt Readings from Last 3 Encounters:  11/13/23 134 lb 9.6 oz (61.1 kg)  06/14/23 140 lb (63.5 kg)   12/06/22 127 lb (57.6 kg)    Physical Exam Vitals reviewed.  Constitutional:      General: She is not in acute distress.    Appearance: She is not ill-appearing, toxic-appearing or diaphoretic.  HENT:     Mouth/Throat:     Mouth: Mucous membranes are moist.  Eyes:     General: No scleral icterus.    Conjunctiva/sclera: Conjunctivae normal.  Cardiovascular:     Rate and Rhythm: Normal rate and regular rhythm.     Heart sounds: No murmur heard.    No friction rub. No gallop.  Pulmonary:     Effort: Pulmonary effort is normal.     Breath sounds: No stridor. No wheezing, rhonchi or rales.  Abdominal:     General: Abdomen is flat. Bowel sounds are normal. There is no distension.     Palpations: Abdomen is soft. There is no hepatomegaly, splenomegaly or mass.     Tenderness: There is abdominal tenderness in the right upper quadrant, right lower quadrant and left lower quadrant. There is no guarding or rebound.     Hernia: No hernia is present.  Musculoskeletal:        General: Normal range of motion.     Cervical back: Neck supple.     Right lower leg: No edema.     Left lower leg: No edema.  Lymphadenopathy:     Cervical: No cervical adenopathy.  Skin:    General: Skin is warm and dry.  Neurological:     General: No focal deficit present.     Mental Status: She is alert.  Psychiatric:        Attention and Perception: She is inattentive.        Mood and Affect: Mood is anxious.        Speech: Speech normal.        Behavior: Behavior normal.        Thought Content: Thought content normal.        Cognition and Memory: Cognition normal.     Lab Results  Component Value Date   WBC 10.4 11/13/2023   HGB 14.1 11/13/2023   HCT 40.6 11/13/2023   PLT 330.0 11/13/2023   GLUCOSE 106 (H) 11/13/2023   CHOL 150 05/26/2018   TRIG 99.0 05/26/2018   HDL 57.20 05/26/2018   LDLCALC 73 05/26/2018   ALT 23 11/13/2023   AST 13 11/13/2023   NA 138 11/13/2023   K 4.3 11/13/2023    CL 102 11/13/2023   CREATININE 0.73 11/13/2023   BUN 8 11/13/2023   CO2 28 11/13/2023   TSH 1.64 11/13/2023   HGBA1C 4.7 05/26/2018    CT Abdomen Pelvis W Contrast Result Date: 05/02/2018 CLINICAL DATA:  Right lower quadrant pain with nausea, vomiting and some constipation for 10 days. EXAM: CT ABDOMEN AND PELVIS WITH CONTRAST TECHNIQUE: Multidetector CT imaging of the abdomen and pelvis was performed using the standard protocol following bolus administration of intravenous contrast. CONTRAST:  15mL ISOVUE-300 IOPAMIDOL (ISOVUE-300) INJECTION 61%, ISOVUE-300 IOPAMIDOL (ISOVUE-300) INJECTION 61% COMPARISON:  None. FINDINGS: Lower chest: Normal size heart. Clear lung bases. No pericardial effusion. Hepatobiliary: Mild periportal edema which can be seen in hypervolemia, hepatic congestion and hepatitis  among some possibilities though not exclusive. Given distended appearance of the IVC, suspect hypervolemia. No space-occupying mass of the liver. Fatty infiltration along the falciform ligament is seen. Gallbladder is contracted and free of stones. Pancreas: Normal Spleen: Normal Adrenals/Urinary Tract: Normal bilateral adrenal glands and kidneys. Mild fullness of the renal pelves without hydroureteronephrosis. The urinary bladder is unremarkable. No bladder calculus or focal mural thickening. Stomach/Bowel: Normal appearing appendix is visualized. The stomach is somewhat distended with ingested food and contrast. The duodenal sweep and ligament of Treitz is well small bowel are unremarkable. Increased fecal retention is seen within the colon. Vascular/Lymphatic: No significant vascular findings are present. No enlarged abdominal or pelvic lymph nodes. Reproductive: Uterus is retroverted and retroflexed. No adnexal mass. Physiologic sized follicles are identified with dominant follicle noted on the left measuring up to 2 cm. Other: No free air nor free fluid. Musculoskeletal: No acute or significant  osseous findings. IMPRESSION: 1. No findings for the patient's right lower quadrant pain apart from increased fecal retention within the colon. The appendix is normal in appearance. No obstructive uropathy is seen. 2. Mild periportal edema is nonspecific but can be seen in hypervolemic state, hepatitis or hepatic congestion among some possibilities. Suspect hypervolemia given distended IVC. Electronically Signed   By: Tollie Eth M.D.   On: 05/02/2018 23:09    Assessment & Plan:   Generalized abdominal tenderness without rebound tenderness- WBC is mildly elevated. Labs are reassuring thus far. CT ordered to evaluate for abscess, appendicitis, bowel obstruction. -     Lipase; Future -     Urinalysis, Routine w reflex microscopic; Future -     Hepatic function panel; Future -     C-reactive protein; Future -     hCG, quantitative, pregnancy; Future -     CBC with Differential/Platelet; Future -     Basic metabolic panel; Future -     GI Profile, Stool, PCR; Future -     CT ABDOMEN PELVIS W CONTRAST; Future  Exhaustion -     Thyroid Panel With TSH; Future -     hCG, quantitative, pregnancy; Future  Diarrhea of infectious origin -     CBC with Differential/Platelet; Future -     Basic metabolic panel; Future -     GI Profile, Stool, PCR; Future     Follow-up: Return if symptoms worsen or fail to improve.  Sanda Linger, MD

## 2023-11-13 NOTE — Telephone Encounter (Signed)
 Patient states that she came in for insomnia and would like something called in to [facility] .  Please advise and send patient a my chart message to let her know when this is sent in.

## 2023-11-13 NOTE — Patient Instructions (Signed)
 Abdominal Pain, Adult  Pain in the abdomen (abdominal pain) can be caused by many things. In most cases, it gets better with no treatment or by being treated at home. But in some cases, it can be serious. Your health care provider will ask questions about your medical history and do a physical exam to try to figure out what is causing your pain. Follow these instructions at home: Medicines Take over-the-counter and prescription medicines only as told by your provider. Do not take medicines that help you poop (laxatives) unless told by your provider. General instructions Watch your condition for any changes. Drink enough fluid to keep your pee (urine) pale yellow. Contact a health care provider if: Your pain changes, gets worse, or lasts longer than expected. You have severe cramping or bloating in your abdomen, or you vomit. Your pain gets worse with meals, after eating, or with certain foods. You are constipated or have diarrhea for more than 2-3 days. You are not hungry, or you lose weight without trying. You have signs of dehydration. These may include: Dark pee, very little pee, or no pee. Cracked lips or dry mouth. Sleepiness or weakness. You have pain when you pee (urinate) or poop. Your abdominal pain wakes you up at night. You have blood in your pee. You have a fever. Get help right away if: You cannot stop vomiting. Your pain is only in one part of the abdomen. Pain on the right side could be caused by appendicitis. You have bloody or black poop (stool), or poop that looks like tar. You have trouble breathing. You have chest pain. These symptoms may be an emergency. Get help right away. Call 911. Do not wait to see if the symptoms will go away. Do not drive yourself to the hospital. This information is not intended to replace advice given to you by your health care provider. Make sure you discuss any questions you have with your health care provider. Document Revised:  06/06/2022 Document Reviewed: 06/06/2022 Elsevier Patient Education  2024 [employer].

## 2023-11-14 ENCOUNTER — Other Ambulatory Visit: Payer: Self-pay | Admitting: Internal Medicine

## 2023-11-14 ENCOUNTER — Encounter: Payer: Self-pay | Admitting: Internal Medicine

## 2023-11-14 ENCOUNTER — Other Ambulatory Visit (HOSPITAL_BASED_OUTPATIENT_CLINIC_OR_DEPARTMENT_OTHER): Payer: Self-pay

## 2023-11-14 DIAGNOSIS — F5104 Psychophysiologic insomnia: Secondary | ICD-10-CM | POA: Insufficient documentation

## 2023-11-14 DIAGNOSIS — R10817 Generalized abdominal tenderness: Secondary | ICD-10-CM

## 2023-11-14 LAB — URINALYSIS, ROUTINE W REFLEX MICROSCOPIC
Bilirubin Urine: NEGATIVE
Hgb urine dipstick: NEGATIVE
Ketones, ur: NEGATIVE
Leukocytes,Ua: NEGATIVE
Nitrite: NEGATIVE
Specific Gravity, Urine: 1.01 (ref 1.000–1.030)
Total Protein, Urine: NEGATIVE
Urine Glucose: NEGATIVE
Urobilinogen, UA: 0.2 (ref 0.0–1.0)
pH: 7 (ref 5.0–8.0)

## 2023-11-14 LAB — THYROID PANEL WITH TSH
Free Thyroxine Index: 2.2 (ref 1.4–3.8)
T3 Uptake: 28 % (ref 22–35)
T4, Total: 8 ug/dL (ref 5.1–11.9)
TSH: 1.64 m[IU]/L

## 2023-11-14 LAB — HCG, QUANTITATIVE, PREGNANCY: HCG, Total, QN: <5 m[IU]/mL

## 2023-11-14 MED ORDER — TRAZODONE HCL 50 MG PO TABS
50.0000 mg | ORAL_TABLET | Freq: Every evening | ORAL | 0 refills | Status: DC | PRN
Start: 2023-11-14 — End: 2023-12-10
  Filled 2023-11-14: qty 30, 30d supply, fill #0

## 2023-11-15 ENCOUNTER — Ambulatory Visit
Admission: RE | Admit: 2023-11-15 | Discharge: 2023-11-15 | Disposition: A | Discharge status: Home / Self Care | Source: Ambulatory Visit | Attending: Internal Medicine | Admitting: Internal Medicine

## 2023-11-15 ENCOUNTER — Other Ambulatory Visit (HOSPITAL_COMMUNITY): Payer: Self-pay

## 2023-11-15 ENCOUNTER — Encounter: Payer: Self-pay | Admitting: Internal Medicine

## 2023-11-15 DIAGNOSIS — R109 Unspecified abdominal pain: Secondary | ICD-10-CM | POA: Diagnosis not present

## 2023-11-15 DIAGNOSIS — R112 Nausea with vomiting, unspecified: Secondary | ICD-10-CM | POA: Diagnosis not present

## 2023-11-15 DIAGNOSIS — R10817 Generalized abdominal tenderness: Secondary | ICD-10-CM

## 2023-11-15 DIAGNOSIS — R630 Anorexia: Secondary | ICD-10-CM | POA: Diagnosis not present

## 2023-11-15 MED ORDER — IOPAMIDOL (ISOVUE-300) INJECTION 61%
100.0000 mL | Freq: Once | INTRAVENOUS | Status: AC | PRN
  Administered 2023-11-15: 100 mL via INTRAVENOUS

## 2023-11-16 ENCOUNTER — Other Ambulatory Visit: Payer: Self-pay | Admitting: Internal Medicine

## 2023-11-16 DIAGNOSIS — K529 Noninfective gastroenteritis and colitis, unspecified: Secondary | ICD-10-CM | POA: Insufficient documentation

## 2023-11-16 LAB — GI PROFILE, STOOL, PCR

## 2023-11-16 MED ORDER — PROMETHAZINE HCL 12.5 MG PO TABS
12.5000 mg | ORAL_TABLET | Freq: Three times a day (TID) | ORAL | 0 refills | Status: DC | PRN
  Filled 2023-11-16: qty 15, 5d supply, fill #0

## 2023-11-18 ENCOUNTER — Other Ambulatory Visit (HOSPITAL_BASED_OUTPATIENT_CLINIC_OR_DEPARTMENT_OTHER): Payer: Self-pay

## 2023-11-25 ENCOUNTER — Encounter: Payer: Self-pay | Admitting: Internal Medicine

## 2023-12-10 ENCOUNTER — Other Ambulatory Visit: Payer: Self-pay | Admitting: Internal Medicine

## 2023-12-10 ENCOUNTER — Other Ambulatory Visit (HOSPITAL_BASED_OUTPATIENT_CLINIC_OR_DEPARTMENT_OTHER): Payer: Self-pay

## 2023-12-10 DIAGNOSIS — F5104 Psychophysiologic insomnia: Secondary | ICD-10-CM

## 2023-12-10 MED ORDER — TRAZODONE HCL 50 MG PO TABS
50.0000 mg | ORAL_TABLET | Freq: Every evening | ORAL | 0 refills | Status: DC | PRN
  Filled 2023-12-10: qty 90, 90d supply, fill #0

## 2023-12-16 ENCOUNTER — Other Ambulatory Visit (HOSPITAL_BASED_OUTPATIENT_CLINIC_OR_DEPARTMENT_OTHER): Payer: Self-pay

## 2023-12-25 ENCOUNTER — Ambulatory Visit: Admitting: Internal Medicine

## 2023-12-25 ENCOUNTER — Encounter: Payer: Self-pay | Admitting: Internal Medicine

## 2023-12-25 VITALS — BP 104/86 | HR 82 | Temp 98.1°F | Resp 16 | Ht 67.3 in | Wt 133.4 lb

## 2023-12-25 DIAGNOSIS — Z0001 Encounter for general adult medical examination with abnormal findings: Secondary | ICD-10-CM

## 2023-12-25 DIAGNOSIS — D72829 Elevated white blood cell count, unspecified: Secondary | ICD-10-CM

## 2023-12-25 DIAGNOSIS — Z1159 Encounter for screening for other viral diseases: Secondary | ICD-10-CM | POA: Insufficient documentation

## 2023-12-25 DIAGNOSIS — Z0000 Encounter for general adult medical examination without abnormal findings: Secondary | ICD-10-CM

## 2023-12-25 DIAGNOSIS — R10817 Generalized abdominal tenderness: Secondary | ICD-10-CM | POA: Diagnosis not present

## 2023-12-25 DIAGNOSIS — Z124 Encounter for screening for malignant neoplasm of cervix: Secondary | ICD-10-CM | POA: Insufficient documentation

## 2023-12-25 LAB — CBC WITH DIFFERENTIAL/PLATELET
Basophils Absolute: 0.1 10*3/uL (ref 0.0–0.1)
Basophils Relative: 0.7 % (ref 0.0–3.0)
Eosinophils Absolute: 0 10*3/uL (ref 0.0–0.7)
Eosinophils Relative: 0.3 % (ref 0.0–5.0)
HCT: 42.3 % (ref 36.0–46.0)
Hemoglobin: 14.5 g/dL (ref 12.0–15.0)
Lymphocytes Relative: 20.3 % (ref 12.0–46.0)
Lymphs Abs: 1.7 10*3/uL (ref 0.7–4.0)
MCHC: 34.2 g/dL (ref 30.0–36.0)
MCV: 87.9 fl (ref 78.0–100.0)
Monocytes Absolute: 0.5 10*3/uL (ref 0.1–1.0)
Monocytes Relative: 6.1 % (ref 3.0–12.0)
Neutro Abs: 6.1 10*3/uL (ref 1.4–7.7)
Neutrophils Relative %: 72.6 % (ref 43.0–77.0)
Platelets: 205 10*3/uL (ref 150.0–400.0)
RBC: 4.82 Mil/uL (ref 3.87–5.11)
RDW: 13.8 % (ref 11.5–15.5)
WBC: 8.4 10*3/uL (ref 4.0–10.5)

## 2023-12-25 LAB — LIPID PANEL
Cholesterol: 148 mg/dL (ref 0–200)
HDL: 79.5 mg/dL (ref >39.00)
LDL Cholesterol: 57 mg/dL (ref 0–99)
NonHDL: 68.65
Total CHOL/HDL Ratio: 2
Triglycerides: 58 mg/dL (ref 0.0–149.0)
VLDL: 11.6 mg/dL (ref 0.0–40.0)

## 2023-12-25 LAB — BASIC METABOLIC PANEL WITH GFR
BUN: 11 mg/dL (ref 6–23)
CO2: 27 meq/L (ref 19–32)
Calcium: 9.8 mg/dL (ref 8.4–10.5)
Chloride: 104 meq/L (ref 96–112)
Creatinine, Ser: 0.67 mg/dL (ref 0.40–1.20)
GFR: 121.65 mL/min (ref >60.00)
Glucose, Bld: 98 mg/dL (ref 70–99)
Potassium: 4 meq/L (ref 3.5–5.1)
Sodium: 138 meq/L (ref 135–145)

## 2023-12-25 NOTE — Patient Instructions (Signed)

## 2023-12-25 NOTE — Progress Notes (Unsigned)
 Subjective:  Patient ID: Beverly Gonzales, female    DOB: 2001/05/26  Age: 23 y.o. MRN: [id number]  CC: Annual Exam   HPI Beverly Gonzales presents for a CPX and f/up ----   Discussed the use of AI scribe software for clinical note transcription with the patient, who gave verbal consent to proceed.  History of Present Illness   Beverly Gonzales is a 23 year old female who presents with ongoing self-confidence issues following a panic attack.  She continues to experience self-confidence issues since a panic attack on a plane, feeling overly cautious and fearful of recurrence, affecting all aspects of her life. 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 She requests a note to excuse her from travel for another month.  She has not taken trazodone  for the past four days and used Unisom last night, resulting in restlessness and poor sleep, totaling about five hours. She questions if this is a common reaction to Unisom. Her appetite is generally okay, though there are days when she has to make herself eat, and she has lost about one and a half pounds recently.  Her last menstrual period was two weeks ago, and she denies any chance of pregnancy.       Outpatient Medications Prior to Visit  Medication Sig Dispense Refill   promethazine  (PHENERGAN ) 12.5 MG tablet Take 1 tablet (12.5 mg total) by mouth every 8 (eight) hours as needed for up to 7 days for nausea or vomiting. 15 tablet 0   traZODone  (DESYREL ) 50 MG tablet Take 1 tablet (50 mg total) by mouth at bedtime as needed for sleep. 90 tablet 0   No facility-administered medications prior to visit.    ROS Review of Systems  Objective:  BP 104/86 (BP Location: Left Arm, Patient Position: Sitting, Cuff Size: Small)   Pulse 82   Temp 98.1 F (36.7 C) (Oral)   Resp 16   Ht 5' 7.3" (1.709 m)   Wt 133 lb 6.4 oz (60.5 kg)   LMP 12/11/2023 (Within Weeks)   SpO2 98%   BMI 20.71  kg/m   BP Readings from Last 3 Encounters:  12/25/23 104/86  11/13/23 100/80  06/14/23 108/72    Wt Readings from Last 3 Encounters:  12/25/23 133 lb 6.4 oz (60.5 kg)  11/13/23 134 lb 9.6 oz (61.1 kg)  06/14/23 140 lb (63.5 kg)    Physical Exam Vitals reviewed.  Constitutional:      Appearance: Normal appearance.  HENT:     Nose: Nose normal.     Mouth/Throat:     Mouth: Mucous membranes are moist.  Eyes:     General: No scleral icterus.    Conjunctiva/sclera: Conjunctivae normal.  Cardiovascular:     Rate and Rhythm: Normal rate and regular rhythm.     Heart sounds: No murmur heard.    No friction rub. No gallop.  Pulmonary:     Effort: Pulmonary effort is normal.     Breath sounds: No stridor. No wheezing, rhonchi or rales.  Abdominal:     General: Abdomen is flat.     Palpations: There is no mass.     Tenderness: There is no abdominal tenderness. There is no guarding.     Hernia: No hernia is present.  Musculoskeletal:        General: Normal range of motion.     Cervical back: Neck supple.     Right lower leg:  No edema.     Left lower leg: No edema.  Lymphadenopathy:     Cervical: No cervical adenopathy.  Skin:    General: Skin is warm and dry.  Neurological:     General: No focal deficit present.     Mental Status: She is alert. Mental status is at baseline.  Psychiatric:        Attention and Perception: She is attentive.        Mood and Affect: Mood is anxious.        Speech: Speech normal.        Behavior: Behavior normal.        Thought Content: Thought content normal.        Cognition and Memory: Cognition normal.        Judgment: Judgment normal.     Lab Results  Component Value Date   WBC 8.4 12/25/2023   HGB 14.5 12/25/2023   HCT 42.3 12/25/2023   PLT 205.0 12/25/2023   GLUCOSE 98 12/25/2023   CHOL 148 12/25/2023   TRIG 58.0 12/25/2023   HDL 79.50 12/25/2023   LDLCALC 57 12/25/2023   ALT 23 11/13/2023   AST 13 11/13/2023   NA 138  12/25/2023   K 4.0 12/25/2023   CL 104 12/25/2023   CREATININE 0.67 12/25/2023   BUN 11 12/25/2023   CO2 27 12/25/2023   TSH 1.64 11/13/2023   HGBA1C 4.7 05/26/2018    CT ABDOMEN PELVIS W CONTRAST Result Date: 11/15/2023 CLINICAL DATA:  Loss of appetite, nausea vomiting, generalized abdominal pain for 5 days EXAM: CT ABDOMEN AND PELVIS WITH CONTRAST TECHNIQUE: Multidetector CT imaging of the abdomen and pelvis was performed using the standard protocol following bolus administration of intravenous contrast. RADIATION DOSE REDUCTION: This exam was performed according to the departmental dose-optimization program which includes automated exposure control, adjustment of the mA and/or kV according to patient size and/or use of iterative reconstruction technique. CONTRAST:  ISOVUE -300 IOPAMIDOL  (ISOVUE -300) INJECTION 61% COMPARISON:  05/02/2018 FINDINGS: Lower chest: No acute pleural or parenchymal lung disease. Hepatobiliary: No focal liver abnormality is seen. No gallstones, gallbladder wall thickening, or biliary dilatation. Pancreas: Unremarkable. No pancreatic ductal dilatation or surrounding inflammatory changes. Spleen: Normal in size without focal abnormality. Adrenals/Urinary Tract: Adrenal glands are unremarkable. Kidneys are normal, without renal calculi, focal lesion, or hydronephrosis. Bladder is decompressed, limiting its evaluation. Stomach/Bowel: No bowel obstruction or ileus. Normal appendix right lower quadrant. No bowel wall thickening or inflammatory change. Vascular/Lymphatic: No significant vascular findings are present. No enlarged abdominal or pelvic lymph nodes. Reproductive: Uterus and bilateral adnexa are unremarkable. Other: No free fluid or free intraperitoneal gas. No abdominal wall hernia. Musculoskeletal: No acute or destructive bony abnormalities. Reconstructed images demonstrate no additional findings. IMPRESSION: 1. No acute intra-abdominal or intrapelvic process.  Electronically Signed   By: Bobbye Burrow M.D.   On: 11/15/2023 14:19    Assessment & Plan:  Need for hepatitis C screening test -     Hepatitis C antibody; Future  Cervical cancer screening -     Ambulatory referral to Gynecology  Encounter for general adult medical examination with abnormal findings -     Lipid panel; Future  Leukocytosis, unspecified type -     CBC with Differential/Platelet; Future  Generalized abdominal tenderness without rebound tenderness -     Basic metabolic panel with GFR     Follow-up: Return in about 6 months (around 06/25/2024).  Sandra Crouch, MD

## 2023-12-26 ENCOUNTER — Encounter: Payer: Self-pay | Admitting: Internal Medicine

## 2023-12-26 LAB — HEPATITIS C ANTIBODY: Hepatitis C Ab: NONREACTIVE

## 2024-02-12 ENCOUNTER — Encounter: Payer: Self-pay | Admitting: Internal Medicine

## 2024-02-12 ENCOUNTER — Other Ambulatory Visit (HOSPITAL_BASED_OUTPATIENT_CLINIC_OR_DEPARTMENT_OTHER): Payer: Self-pay

## 2024-02-12 ENCOUNTER — Ambulatory Visit: Admitting: Internal Medicine

## 2024-02-12 VITALS — BP 106/76 | HR 88 | Temp 98.6°F | Resp 16 | Ht 67.3 in | Wt 136.0 lb

## 2024-02-12 DIAGNOSIS — F40243 Fear of flying: Secondary | ICD-10-CM

## 2024-02-12 MED ORDER — DIAZEPAM 2 MG PO TABS
2.0000 mg | ORAL_TABLET | ORAL | 1 refills | Status: AC
  Filled 2024-02-12: qty 30, 30d supply, fill #0

## 2024-02-12 NOTE — Patient Instructions (Signed)

## 2024-02-12 NOTE — Progress Notes (Signed)
 Subjective:  Patient ID: Beverly Gonzales, female    DOB: 2001-04-25  Age: 23 y.o. MRN: [id number]  CC: Follow-up (Still having issues with the situation on the plane)   HPI Beverly Gonzales presents for f/up ----  Discussed the use of AI scribe software for clinical note transcription with the patient, who gave verbal consent to proceed.  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  She experiences significant anxiety since an incident on a plane, which has led to a fear of flying. She is scheduled to travel to [place] next week and seeks assistance to manage her anxiety during the flight. Her anxiety manifests as 'constant thoughts' and she feels she has not yet overcome the fear created by the incident.  She has made lifestyle changes, including walking five to seven miles daily, and prefers to manage her anxiety naturally without regular medication. However, she is open to taking medication specifically for flights to 'take the edge off'.  No issues with eating, sleeping, or maintaining normal relationships. No weight loss, suicidal or homicidal thoughts, and she maintains a normal menstrual cycle with the last cycle occurring two weeks ago. She is a nonsmoker and nondrinker, and follows a healthy diet rich in greens, vegetables, and fruits.  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       No outpatient medications prior to visit.   No facility-administered medications prior to visit.    ROS Review of Systems  Constitutional: Negative.  Negative for chills, diaphoresis and fatigue.  HENT: Negative.    Respiratory: Negative.  Negative for cough and shortness of breath.   Cardiovascular:  Negative for chest pain, palpitations and leg swelling.  Gastrointestinal: Negative.  Negative for abdominal pain, constipation, diarrhea,  nausea and vomiting.  Endocrine: Negative.   Genitourinary: Negative.  Negative for difficulty urinating.  Musculoskeletal: Negative.   Skin: Negative.   Neurological: Negative.  Negative for dizziness.  Hematological:  Negative for adenopathy. Does not bruise/bleed easily.  Psychiatric/Behavioral:  Negative for dysphoric mood, sleep disturbance and suicidal ideas. The patient is nervous/anxious.     Objective:  BP 106/76 (BP Location: Left Arm, Patient Position: Sitting, Cuff Size: Small)   Pulse 88   Temp 98.6 F (37 C) (Temporal)   Resp 16   Ht 5' 7.3 (1.709 m)   Wt 136 lb (61.7 kg)   LMP 01/27/2024 (Approximate)   SpO2 99%   BMI 21.11 kg/m   BP Readings from Last 3 Encounters:  02/12/24 106/76  12/25/23 104/86  11/13/23 100/80    Wt Readings from Last 3 Encounters:  02/12/24 136 lb (61.7 kg)  12/25/23 133 lb 6.4 oz (60.5 kg)  11/13/23 134 lb 9.6 oz (61.1 kg)    Physical Exam Vitals reviewed.   Eyes:     General: No scleral icterus.    Conjunctiva/sclera: Conjunctivae normal.    Cardiovascular:     Rate and Rhythm: Normal rate and regular rhythm.     Heart sounds: No murmur heard. Pulmonary:     Effort: Pulmonary effort is normal.     Breath sounds: No stridor. No wheezing, rhonchi or rales.  Abdominal:     General: Abdomen is flat.     Palpations: There is no mass.     Tenderness: There is no abdominal tenderness. There is no guarding.  Hernia: No hernia is present.   Musculoskeletal:        General: Normal range of motion.     Cervical back: Neck supple.     Right lower leg: No edema.     Left lower leg: No edema.  Lymphadenopathy:     Cervical: No cervical adenopathy.   Skin:    General: Skin is warm and dry.   Psychiatric:        Attention and Perception: Attention and perception normal. She is attentive.        Mood and Affect: Affect normal. Mood is anxious.        Behavior: Behavior normal.        Thought Content: Thought content  normal.        Cognition and Memory: Cognition normal.        Judgment: Judgment normal.     Lab Results  Component Value Date   WBC 8.4 12/25/2023   HGB 14.5 12/25/2023   HCT 42.3 12/25/2023   PLT 205.0 12/25/2023   GLUCOSE 98 12/25/2023   CHOL 148 12/25/2023   TRIG 58.0 12/25/2023   HDL 79.50 12/25/2023   LDLCALC 57 12/25/2023   ALT 23 11/13/2023   AST 13 11/13/2023   NA 138 12/25/2023   K 4.0 12/25/2023   CL 104 12/25/2023   CREATININE 0.67 12/25/2023   BUN 11 12/25/2023   CO2 27 12/25/2023   TSH 1.64 11/13/2023   HGBA1C 4.7 05/26/2018    CT ABDOMEN PELVIS W CONTRAST Result Date: 11/15/2023 CLINICAL DATA:  Loss of appetite, nausea vomiting, generalized abdominal pain for 5 days EXAM: CT ABDOMEN AND PELVIS WITH CONTRAST TECHNIQUE: Multidetector CT imaging of the abdomen and pelvis was performed using the standard protocol following bolus administration of intravenous contrast. RADIATION DOSE REDUCTION: This exam was performed according to the departmental dose-optimization program which includes automated exposure control, adjustment of the mA and/or kV according to patient size and/or use of iterative reconstruction technique. CONTRAST:  100mL ISOVUE -300 IOPAMIDOL  (ISOVUE -300) INJECTION 61% COMPARISON:  05/02/2018 FINDINGS: Lower chest: No acute pleural or parenchymal lung disease. Hepatobiliary: No focal liver abnormality is seen. No gallstones, gallbladder wall thickening, or biliary dilatation. Pancreas: Unremarkable. No pancreatic ductal dilatation or surrounding inflammatory changes. Spleen: Normal in size without focal abnormality. Adrenals/Urinary Tract: Adrenal glands are unremarkable. Kidneys are normal, without renal calculi, focal lesion, or hydronephrosis. Bladder is decompressed, limiting its evaluation. Stomach/Bowel: No bowel obstruction or ileus. Normal appendix right lower quadrant. No bowel wall thickening or inflammatory change. Vascular/Lymphatic: No significant  vascular findings are present. No enlarged abdominal or pelvic lymph nodes. Reproductive: Uterus and bilateral adnexa are unremarkable. Other: No free fluid or free intraperitoneal gas. No abdominal wall hernia. Musculoskeletal: No acute or destructive bony abnormalities. Reconstructed images demonstrate no additional findings. IMPRESSION: 1. No acute intra-abdominal or intrapelvic process. Electronically Signed   By: Bobbye Burrow M.D.   On: 11/15/2023 14:19    Assessment & Plan:  Fear of flying -     diazePAM ; Take 1 tablet (2 mg total) by mouth once.  Dispense: 30 tablet; Refill: 1     Follow-up: Return in about 6 months (around 08/13/2024).  Sandra Crouch, MD

## 2024-02-14 ENCOUNTER — Encounter: Payer: Self-pay | Admitting: Internal Medicine

## 2024-02-25 ENCOUNTER — Other Ambulatory Visit (HOSPITAL_BASED_OUTPATIENT_CLINIC_OR_DEPARTMENT_OTHER): Payer: Self-pay

## 2024-03-12 ENCOUNTER — Encounter: Payer: Self-pay | Admitting: Obstetrics and Gynecology

## 2024-06-19 ENCOUNTER — Ambulatory Visit: Admitting: Family Medicine

## 2024-06-19 ENCOUNTER — Other Ambulatory Visit (HOSPITAL_BASED_OUTPATIENT_CLINIC_OR_DEPARTMENT_OTHER): Payer: Self-pay

## 2024-06-19 ENCOUNTER — Encounter: Payer: Self-pay | Admitting: Family Medicine

## 2024-06-19 VITALS — BP 102/68 | HR 91 | Temp 98.0°F | Ht 67.3 in | Wt 140.0 lb

## 2024-06-19 DIAGNOSIS — R6889 Other general symptoms and signs: Secondary | ICD-10-CM | POA: Diagnosis not present

## 2024-06-19 DIAGNOSIS — J101 Influenza due to other identified influenza virus with other respiratory manifestations: Secondary | ICD-10-CM

## 2024-06-19 LAB — POCT INFLUENZA A/B
Influenza A, POC: POSITIVE — AB
Influenza B, POC: NEGATIVE

## 2024-06-19 LAB — POC COVID19 BINAXNOW: SARS Coronavirus 2 Ag: NEGATIVE

## 2024-06-19 MED ORDER — OSELTAMIVIR PHOSPHATE 75 MG PO CAPS
75.0000 mg | ORAL_CAPSULE | Freq: Two times a day (BID) | ORAL | 0 refills | Status: AC
  Filled 2024-06-19: qty 10, 5d supply, fill #0

## 2024-06-19 NOTE — Progress Notes (Unsigned)
   Acute Office Visit  Subjective:     Patient ID: Beverly Gonzales, female    DOB: Jun 12, 2001, 23 y.o.   MRN: [id number]  No chief complaint on file.   HPI  Discussed the use of AI scribe software for clinical note transcription with the patient, who gave verbal consent to proceed.  History of Present Illness      ROS Per HPI      Objective:    There were no vitals taken for this visit.   Physical Exam Vitals and nursing note reviewed.  Constitutional:      General: She is not in acute distress.    Appearance: Normal appearance. She is normal weight.  HENT:     Head: Normocephalic and atraumatic.     Right Ear: External ear normal.     Left Ear: External ear normal.     Nose: Nose normal.     Mouth/Throat:     Mouth: Mucous membranes are moist.     Pharynx: Oropharynx is clear.  Eyes:     Extraocular Movements: Extraocular movements intact.     Pupils: Pupils are equal, round, and reactive to light.  Cardiovascular:     Rate and Rhythm: Normal rate and regular rhythm.     Pulses: Normal pulses.     Heart sounds: Normal heart sounds.  Pulmonary:     Effort: Pulmonary effort is normal. No respiratory distress.     Breath sounds: Normal breath sounds. No wheezing, rhonchi or rales.  Musculoskeletal:        General: Normal range of motion.     Cervical back: Normal range of motion.     Right lower leg: No edema.     Left lower leg: No edema.  Lymphadenopathy:     Cervical: No cervical adenopathy.  Neurological:     General: No focal deficit present.     Mental Status: She is alert and oriented to person, place, and time.  Psychiatric:        Mood and Affect: Mood normal.        Thought Content: Thought content normal.     No results found for any visits on 06/19/24.      Assessment & Plan:   Assessment and Plan Assessment & Plan      No orders of the defined types were placed in this encounter.    No orders of the defined types were placed  in this encounter.   No follow-ups on file.  Corean LITTIE Ku, FNP

## 2024-06-19 NOTE — Patient Instructions (Signed)
 Your respiratory test was positive flu A.  I have sent in tamiflu for you to take twice a day for 5 days.   Follow-up with me for new or worsening symptoms.
